# Patient Record
Sex: Female | Born: 1975 | Race: Black or African American | Hispanic: No | Marital: Married | Smoking: Never smoker
Health system: Southern US, Community
[De-identification: ages and names within clinical notes are randomized; demographics above are authoritative.]

## PROBLEM LIST (undated history)

## (undated) DIAGNOSIS — E785 Hyperlipidemia, unspecified: Secondary | ICD-10-CM

## (undated) DIAGNOSIS — R519 Headache, unspecified: Secondary | ICD-10-CM

## (undated) DIAGNOSIS — F8081 Childhood onset fluency disorder: Secondary | ICD-10-CM

## (undated) DIAGNOSIS — H547 Unspecified visual loss: Secondary | ICD-10-CM

## (undated) DIAGNOSIS — M1990 Unspecified osteoarthritis, unspecified site: Secondary | ICD-10-CM

## (undated) DIAGNOSIS — R7303 Prediabetes: Secondary | ICD-10-CM

## (undated) DIAGNOSIS — R635 Abnormal weight gain: Secondary | ICD-10-CM

## (undated) DIAGNOSIS — R413 Other amnesia: Secondary | ICD-10-CM

## (undated) DIAGNOSIS — R410 Disorientation, unspecified: Secondary | ICD-10-CM

## (undated) DIAGNOSIS — R51 Headache: Secondary | ICD-10-CM

## (undated) HISTORY — DX: Childhood onset fluency disorder: F80.81

## (undated) HISTORY — DX: Abnormal weight gain: R63.5

## (undated) HISTORY — DX: Headache, unspecified: R51.9

## (undated) HISTORY — DX: Other amnesia: R41.3

## (undated) HISTORY — DX: Prediabetes: R73.03

## (undated) HISTORY — DX: Hyperlipidemia, unspecified: E78.5

## (undated) HISTORY — PX: MOUTH SURGERY: SHX715

## (undated) HISTORY — PX: UTERINE FIBROID SURGERY: SHX826

## (undated) HISTORY — DX: Unspecified visual loss: H54.7

## (undated) HISTORY — DX: Unspecified osteoarthritis, unspecified site: M19.90

## (undated) HISTORY — DX: Headache: R51

## (undated) HISTORY — PX: ABDOMINAL HYSTERECTOMY: SHX81

## (undated) HISTORY — DX: Disorientation, unspecified: R41.0

---

## 2005-10-06 ENCOUNTER — Encounter: Payer: Self-pay | Admitting: Family Medicine

## 2008-10-28 ENCOUNTER — Emergency Department (HOSPITAL_BASED_OUTPATIENT_CLINIC_OR_DEPARTMENT_OTHER): Admission: EM | Admit: 2008-10-28 | Discharge: 2008-10-28 | Payer: Self-pay | Admitting: Emergency Medicine

## 2009-01-07 ENCOUNTER — Emergency Department (HOSPITAL_BASED_OUTPATIENT_CLINIC_OR_DEPARTMENT_OTHER): Admission: EM | Admit: 2009-01-07 | Discharge: 2009-01-07 | Payer: Self-pay | Admitting: Emergency Medicine

## 2009-01-15 ENCOUNTER — Ambulatory Visit: Payer: Self-pay | Admitting: Obstetrics and Gynecology

## 2009-01-15 LAB — CONVERTED CEMR LAB: Trich, Wet Prep: NONE SEEN

## 2009-02-25 ENCOUNTER — Ambulatory Visit: Payer: Self-pay | Admitting: Obstetrics and Gynecology

## 2009-03-12 ENCOUNTER — Ambulatory Visit: Payer: Self-pay | Admitting: Obstetrics & Gynecology

## 2009-03-18 ENCOUNTER — Ambulatory Visit (HOSPITAL_COMMUNITY): Admission: RE | Admit: 2009-03-18 | Discharge: 2009-03-18 | Payer: Self-pay | Admitting: Obstetrics & Gynecology

## 2009-04-27 ENCOUNTER — Inpatient Hospital Stay (HOSPITAL_COMMUNITY): Admission: RE | Admit: 2009-04-27 | Discharge: 2009-04-29 | Payer: Self-pay | Admitting: Obstetrics & Gynecology

## 2009-04-27 ENCOUNTER — Encounter: Payer: Self-pay | Admitting: Obstetrics & Gynecology

## 2009-04-27 ENCOUNTER — Ambulatory Visit: Payer: Self-pay | Admitting: Obstetrics & Gynecology

## 2009-05-15 ENCOUNTER — Ambulatory Visit: Payer: Self-pay | Admitting: Obstetrics & Gynecology

## 2009-05-28 ENCOUNTER — Ambulatory Visit: Payer: Self-pay | Admitting: Obstetrics & Gynecology

## 2009-06-10 ENCOUNTER — Ambulatory Visit: Payer: Self-pay | Admitting: Obstetrics & Gynecology

## 2009-09-16 ENCOUNTER — Ambulatory Visit: Payer: Self-pay | Admitting: Family Medicine

## 2009-09-16 ENCOUNTER — Encounter (INDEPENDENT_AMBULATORY_CARE_PROVIDER_SITE_OTHER): Payer: Self-pay | Admitting: *Deleted

## 2009-09-16 DIAGNOSIS — E669 Obesity, unspecified: Secondary | ICD-10-CM

## 2009-09-16 DIAGNOSIS — M79609 Pain in unspecified limb: Secondary | ICD-10-CM

## 2009-09-16 DIAGNOSIS — Z8541 Personal history of malignant neoplasm of cervix uteri: Secondary | ICD-10-CM

## 2009-09-16 DIAGNOSIS — G932 Benign intracranial hypertension: Secondary | ICD-10-CM | POA: Insufficient documentation

## 2009-09-16 LAB — CONVERTED CEMR LAB
Hemoglobin: 11.9 g/dL — ABNORMAL LOW (ref 12.0–15.0)
RBC: 3.9 M/uL (ref 3.87–5.11)
RDW: 12.8 % (ref 11.5–15.5)

## 2009-09-30 ENCOUNTER — Ambulatory Visit: Payer: Self-pay | Admitting: Family Medicine

## 2009-09-30 ENCOUNTER — Encounter: Payer: Self-pay | Admitting: Family Medicine

## 2009-09-30 LAB — CONVERTED CEMR LAB
AST: 15 units/L (ref 0–37)
Alkaline Phosphatase: 50 units/L (ref 39–117)
Amphetamine Screen, Ur: NEGATIVE
CO2: 24 meq/L (ref 19–32)
Chloride: 106 meq/L (ref 96–112)
Cholesterol: 211 mg/dL — ABNORMAL HIGH (ref 0–200)
Creatinine,U: 138.5 mg/dL
Ethyl Alcohol: <10 mg/dL (ref <10)
Glucose, Bld: 93 mg/dL (ref 70–99)
HDL: 68 mg/dL (ref >39)
LDL Cholesterol: 129 mg/dL — ABNORMAL HIGH (ref 0–99)
Marijuana Metabolite: NEGATIVE
Methadone: NEGATIVE
Opiate Screen, Urine: NEGATIVE
Phencyclidine (PCP): NEGATIVE
Potassium: 5.1 meq/L (ref 3.5–5.3)
Propoxyphene: NEGATIVE
Total Protein: 7.1 g/dL (ref 6.0–8.3)
VLDL: 14 mg/dL (ref 0–40)

## 2009-10-09 ENCOUNTER — Ambulatory Visit: Payer: Self-pay | Admitting: Family Medicine

## 2009-10-09 DIAGNOSIS — N644 Mastodynia: Secondary | ICD-10-CM | POA: Insufficient documentation

## 2009-10-09 DIAGNOSIS — R350 Frequency of micturition: Secondary | ICD-10-CM | POA: Insufficient documentation

## 2009-10-09 DIAGNOSIS — H4710 Unspecified papilledema: Secondary | ICD-10-CM

## 2009-10-09 DIAGNOSIS — G5600 Carpal tunnel syndrome, unspecified upper limb: Secondary | ICD-10-CM

## 2009-10-09 LAB — CONVERTED CEMR LAB
Bilirubin Urine: NEGATIVE
Urobilinogen, UA: 0.2
pH: 6

## 2009-10-13 ENCOUNTER — Encounter: Admission: RE | Admit: 2009-10-13 | Discharge: 2009-10-13 | Payer: Self-pay | Admitting: Family Medicine

## 2009-10-14 ENCOUNTER — Encounter: Payer: Self-pay | Admitting: Family Medicine

## 2009-10-16 ENCOUNTER — Telehealth: Payer: Self-pay | Admitting: Family Medicine

## 2009-10-23 ENCOUNTER — Encounter: Payer: Self-pay | Admitting: *Deleted

## 2009-10-26 ENCOUNTER — Ambulatory Visit (HOSPITAL_COMMUNITY): Admission: RE | Admit: 2009-10-26 | Discharge: 2009-10-26 | Payer: Self-pay | Admitting: Family Medicine

## 2009-10-29 ENCOUNTER — Telehealth: Payer: Self-pay | Admitting: Family Medicine

## 2009-11-06 ENCOUNTER — Ambulatory Visit: Payer: Self-pay | Admitting: Family Medicine

## 2009-11-26 ENCOUNTER — Ambulatory Visit: Payer: Self-pay | Admitting: Sports Medicine

## 2009-12-01 ENCOUNTER — Ambulatory Visit: Payer: Self-pay | Admitting: Family Medicine

## 2009-12-01 LAB — CONVERTED CEMR LAB
Albumin: 4 g/dL (ref 3.5–5.2)
Alkaline Phosphatase: 50 units/L (ref 39–117)
BUN: 12 mg/dL (ref 6–23)
Bilirubin Urine: NEGATIVE
CO2: 24 meq/L (ref 19–32)
Calcium: 8.5 mg/dL (ref 8.4–10.5)
Chloride: 106 meq/L (ref 96–112)
Ketones, urine, test strip: NEGATIVE
Potassium: 4.1 meq/L (ref 3.5–5.3)
Protein, U semiquant: NEGATIVE
Sodium: 139 meq/L (ref 135–145)
Total Bilirubin: 0.3 mg/dL (ref 0.3–1.2)
Total Protein: 7.1 g/dL (ref 6.0–8.3)
WBC Urine, dipstick: NEGATIVE

## 2009-12-02 ENCOUNTER — Encounter: Payer: Self-pay | Admitting: Family Medicine

## 2009-12-17 ENCOUNTER — Telehealth (INDEPENDENT_AMBULATORY_CARE_PROVIDER_SITE_OTHER): Payer: Self-pay | Admitting: *Deleted

## 2009-12-21 ENCOUNTER — Telehealth (INDEPENDENT_AMBULATORY_CARE_PROVIDER_SITE_OTHER): Payer: Self-pay | Admitting: *Deleted

## 2009-12-25 ENCOUNTER — Ambulatory Visit: Payer: Self-pay | Admitting: Family Medicine

## 2009-12-29 ENCOUNTER — Encounter: Payer: Self-pay | Admitting: Family Medicine

## 2010-01-02 ENCOUNTER — Emergency Department (HOSPITAL_BASED_OUTPATIENT_CLINIC_OR_DEPARTMENT_OTHER): Admission: EM | Admit: 2010-01-02 | Discharge: 2010-01-03 | Payer: Self-pay | Admitting: Emergency Medicine

## 2010-01-22 ENCOUNTER — Encounter: Payer: Self-pay | Admitting: Family Medicine

## 2010-01-25 ENCOUNTER — Encounter: Payer: Self-pay | Admitting: Family Medicine

## 2010-01-26 ENCOUNTER — Ambulatory Visit: Payer: Self-pay | Admitting: Family Medicine

## 2010-02-19 ENCOUNTER — Encounter: Payer: Self-pay | Admitting: Family Medicine

## 2010-02-22 ENCOUNTER — Encounter: Payer: Self-pay | Admitting: Family Medicine

## 2010-03-03 ENCOUNTER — Encounter: Payer: Self-pay | Admitting: Family Medicine

## 2010-03-09 ENCOUNTER — Ambulatory Visit: Payer: Self-pay | Admitting: Family Medicine

## 2010-03-09 ENCOUNTER — Encounter: Payer: Self-pay | Admitting: *Deleted

## 2010-03-09 DIAGNOSIS — R300 Dysuria: Secondary | ICD-10-CM | POA: Insufficient documentation

## 2010-03-09 LAB — CONVERTED CEMR LAB
Blood in Urine, dipstick: NEGATIVE
Epithelial cells, urine: >20 /lpf
Ketones, urine, test strip: NEGATIVE
Protein, U semiquant: NEGATIVE
Specific Gravity, Urine: 1.02
Urobilinogen, UA: 0.2

## 2010-03-11 ENCOUNTER — Ambulatory Visit (HOSPITAL_COMMUNITY): Admission: RE | Admit: 2010-03-11 | Discharge: 2010-03-11 | Payer: Self-pay | Admitting: Family Medicine

## 2010-03-11 ENCOUNTER — Encounter: Payer: Self-pay | Admitting: Family Medicine

## 2010-03-11 ENCOUNTER — Ambulatory Visit: Payer: Self-pay | Admitting: Surgery

## 2010-03-15 ENCOUNTER — Encounter: Payer: Self-pay | Admitting: Family Medicine

## 2010-03-18 ENCOUNTER — Encounter: Payer: Self-pay | Admitting: Family Medicine

## 2010-03-18 DIAGNOSIS — Q283 Other malformations of cerebral vessels: Secondary | ICD-10-CM | POA: Insufficient documentation

## 2010-04-30 ENCOUNTER — Encounter: Payer: Self-pay | Admitting: Family Medicine

## 2010-06-04 ENCOUNTER — Ambulatory Visit: Payer: Self-pay | Admitting: Family Medicine

## 2010-06-07 ENCOUNTER — Telehealth: Payer: Self-pay | Admitting: *Deleted

## 2010-06-18 ENCOUNTER — Encounter: Payer: Self-pay | Admitting: Family Medicine

## 2010-07-02 ENCOUNTER — Encounter: Payer: Self-pay | Admitting: Family Medicine

## 2010-07-07 ENCOUNTER — Ambulatory Visit: Payer: Self-pay | Admitting: Family Medicine

## 2010-08-02 ENCOUNTER — Encounter: Payer: Self-pay | Admitting: Family Medicine

## 2010-08-15 HISTORY — PX: BRAIN SURGERY: SHX531

## 2010-09-14 NOTE — Assessment & Plan Note (Signed)
Summary: NP,tcb   Vital Signs:  Patient profile:   35 year old female Height:      62 inches Weight:      238.5 pounds BMI:     43.78 Temp:     97.5 degrees F oral Pulse rate:   74 / minute BP sitting:   133 / 83  (left arm) Cuff size:   regular  Vitals Entered By: Gladstone Pih (September 16, 2009 9:49 AM) CC: NP, get est, pain from hips down X 4 month, refill pain meds Is Patient Diabetic? No Pain Assessment Patient in pain? yes     Location: legs Intensity: 8 Type: sharp Onset of pain  X 4 mos   CC:  NP, get est, pain from hips down X 4 month, and refill pain meds.  History of Present Illness: Patient is new to our office, she has recently re-acquired Medicaid coverage and is trying to re-establish care,.  Has not been under regular medical care since 2006.  Her main issues of concern on this first visit regard her bilat lowe ext pain, which radiates down from her hips to her toes.  Worse when she has been standing for a logn while at work.  No weakness or falls. No trauma history.   She relates the worsening of the pain to recent surgery for BSO in Sept 2010 by Dr. Silas Flood in [facility], also to prior surgery in 2006 for hysterectomy for endometriosis.  Also of note, patient was diagnosed with pseudotumor cerebri in 2006 by neurologists in [facility], workup included MRI and LP.  She had failed Topamax and NSAIDs, Tylenol at that time.  She was controlled on Vicodin for her HA pain, which she does not have now.   Regarding pain control, she requests a refill of her Vicodin.  She had been on Vicodin for her pseudotumor with good results; also postoperatively from her pelvic surgeries and she states that most recentlty she had been maintained on it for her bilateral leg pain.  She is not sure of her diagnosis regarding the leg pain.  Her prior PCP was Dr. Babs Sciara in [facility].   Habits & Providers  Alcohol-Tobacco-Diet     Tobacco Status: never  Current Medications  (verified): 1)  Hydrocodone-Acetaminophen 5-500 Mg Tabs (Hydrocodone-Acetaminophen) .... Sig: Take 1 To 2 Tabs By Mouth Every 12 Hours As Needed For Extreme Pain 2)  Estradiol 2 Mg Tabs (Estradiol) .Marland Kitchen.. 1 Tab By Mouth Once Daily 3)  Amitriptyline Hcl 75 Mg Tabs (Amitriptyline Hcl) .... Sig: Take 1 Tab By Mouth Once Daily  Allergies (verified): 1)  ! Tramadol Hcl (Tramadol Hcl)  Past History:  Past Medical History: cervical cancer 2006, s/p hysterectomy. Bilat oophorectomy Sept 13, 2010.  Pseudotumor cerebri,workup with [facility] in [facility], workup included MRI, LP.  Was maintained on Vicodin for headache pain.  Failed Topamax, NSAIDs and Tylenol in 2006. Cervical cancer, hx of  Family History: Maternal grandmother with MI in her 65s.  Neither parent with MI.  No DM in family. No forms of cancer in family.  Social History: Married.  Has three children at home. Husband out of work. Works at [employer], on her feet for 4 hrs at at time.  Never smoked, no alcohol or recreational drugs. Smoking Status:  never  Review of Systems  The patient denies fever, weight loss, chest pain, syncope, dyspnea on exertion, headaches, and abdominal pain.    Physical Exam  General:  Obese well-appearing  woman, no apparent distress.  Eyes:  Clear sclerae bilaterally Ears:  External ear exam shows no significant lesions or deformities.  Otoscopic examination reveals clear canals, tympanic membranes are intact bilaterally without bulging, retraction, inflammation or discharge. Hearing is grossly normal bilaterally. Nose:  Small amount thin watery nasal discharge Mouth:  Oral mucosa and oropharynx without lesions or exudates.  Teeth in good repair. Neck:  No deformities, masses, or tenderness noted. Lungs:  Normal respiratory effort, chest expands symmetrically. Lungs are clear to auscultation, no crackles or wheezes. Heart:  Normal rate and regular rhythm. S1 and S2 normal without  gallop, murmur, click, rub or other extra sounds. Abdomen:  Obese, nontender.  Msk:  Tenderness to palpate along the paraspinous mm lumbar region.  Straight leg raise negative.   Full passive ROM with hip flex/extension, rotation int/ext, abd/adduction. Pulses:  Palpable dp pulses bilaterally. Extremities:  No edema of feet.  No skin maceration between toes. Neurologic:  Sensation in feet grossly intact with fine touch, pinprick.    Impression & Recommendations:  Problem # 1:  LEG PAIN, BILATERAL (ICD-729.5) Unclear what diagnosis we are treating.  By my exam, no clear lumbar radiculopathy.  Will request old records from Dr Willa Rough, from Neurologists in order to better understand prior treatment.  Will refill her Vicodin today, with the understanding that we need to get a better idea of what we are treating.  Discussed the nuances of Controlled Substances prescribing in this office, including the terms of Pain Med Contract.  We will only engage in such a contract if it is decided that this medication will be a chronic medication.  Future Orders: Miscellaneous Lab Charge-[facility] [id number]) ... 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  Had been off the Vicodin for some time.  Will request records from her neurologists.  Problem # 3:  OBESITY, UNSPECIFIED (ICD-278.00) Patient has not had assessment for DM screening, lipid screening. W ill order these, alogn with CBC and UDS (see #1).  Orders: CBC-[facility] ([id number])Future Orders: Lipid-[facility] ([id number]) ... 09/29/2010 Comp Met-[facility] ([id number]) ... 09/16/2010  Complete Medication List: 1)  Hydrocodone-acetaminophen 5-500 Mg Tabs (Hydrocodone-acetaminophen) .... Sig: take 1 to 2 tabs by mouth every 12 hours as needed for extreme pain 2)  Estradiol 2 Mg Tabs (Estradiol) .Marland Kitchen.. 1 tab by mouth once daily 3)  Amitriptyline Hcl 75 Mg Tabs (Amitriptyline hcl) .... Sig: take 1 tab by mouth once daily  Other Orders: Flu  Vaccine 48yrs + (13086) Admin 1st Vaccine ([id number])  Patient Instructions: 1)  It was a pleasure to see you today.  2)  I am refilling your medications, including the Vicodin, for the coming month.  It will be important to get the old records from your former primary doctor, your neurologist at [facility] in [facility], and from Dr. Silas Flood in [facility]. 3)  I would like you to come for fasting labs in the coming 2 weeks, and to see me in the office in 4 to 5 weeks. Prescriptions: AMITRIPTYLINE HCL 75 MG TABS (AMITRIPTYLINE HCL) SIG: Take 1 tab by mouth once daily  #30 x 11   Entered and Authorized by:   Paula Compton MD   Signed by:   Paula Compton MD on 09/16/2009   Method used:   Electronically to        [facility] (retail)       [address]       [place], [place]  [address]       Ph: [phone]       Fax: [phone]   RxID:   [id number] ESTRADIOL 2 MG TABS (ESTRADIOL) 1 tab by mouth once daily  #30 x 11   Entered and Authorized by:   Paula Compton MD   Signed by:   Paula Compton MD on 09/16/2009   Method used:   Electronically to        [facility] (retail)       [address]       [place], [place]  [address]       Ph: [phone]       Fax: [phone]   RxID:   [id number] HYDROCODONE-ACETAMINOPHEN 5-500 MG TABS (HYDROCODONE-ACETAMINOPHEN) SIG: Take 1 to 2 tabs by mouth every 12 hours as needed for extreme pain  #120 x 0   Entered and Authorized by:   Paula Compton MD   Signed by:   Paula Compton MD on 09/16/2009   Method used:   Print then Give to Patient   RxID:   [id number]     Immunizations Administered:  Influenza Vaccine # 1:    Vaccine Type: Fluvax 3+    Site: right deltoid    Mfr: GlaxoSmithKline    Dose: 0.5 ml    Given by: Gladstone Pih    Exp. Date: 02/11/2010    Lot #: [id number]    VIS given: 05/2009    Physician counseled: yes  Flu Vaccine Consent Questions:    Do you  have a history of severe allergic reactions to this vaccine? no    Any prior history of allergic reactions to egg and/or gelatin? no    Do you have a sensitivity to the preservative Thimersol? no    Do you have a past history of Guillan-Barre Syndrome? no    Do you currently have an acute febrile illness? no    Have you ever had a severe reaction to latex? no    Vaccine information given and explained to patient? yes    Are you currently pregnant? no

## 2010-09-14 NOTE — Consult Note (Signed)
[facility]   Imported By: Clydell Hakim 05/12/2010 11:55:27  _____________________________________________________________________  External Attachment:    Type:   Image     Comment:   External Document

## 2010-09-14 NOTE — Miscellaneous (Signed)
Summary: Re: Neurosurgery  referral.  Clinical Lists Changes  received notification from [facility] that they are unable to complete Neurosurgery referral at this time due to lack of volunteer physicians in this speciality group. they will notifiy patient when they can process this referral. Theresia Lo RN  October 14, 2009 4:07 PM

## 2010-09-14 NOTE — Consult Note (Signed)
Summary: [facility]   Imported By: Clydell Hakim 03/23/2010 15:01:17  _____________________________________________________________________  External Attachment:    Type:   Image     Comment:   External Document

## 2010-09-14 NOTE — Miscellaneous (Signed)
Summary: [facility]   Imported By: Clydell Hakim 03/23/2010 15:03:13  _____________________________________________________________________  External Attachment:    Type:   Image     Comment:   External Document

## 2010-09-14 NOTE — Consult Note (Signed)
Summary: [facility]   Imported By: Clydell Hakim 03/23/2010 14:46:55  _____________________________________________________________________  External Attachment:    Type:   Image     Comment:   External Document

## 2010-09-14 NOTE — Progress Notes (Signed)
Summary: phn msg  Phone Note Call from Patient Call back at Home Phone [phone]   Caller: Patient Summary of Call: Asking to speak with Larita Fife concerning a procedure she had done on Monday. Initial call taken by: Clydell Hakim,  October 29, 2009 10:34 AM  Follow-up for Phone Call        spoke with patient and she would like results of MRI. advised will send message to Dr. Mauricio Po . may call her back at work # [phone] or cell number listed. advised patient that referral along with MRI results has been faxed to [facility], and waiting on appointment . Follow-up by: Theresia Lo RN,  October 29, 2009 10:49 AM    New/Updated Medications: HYDROCODONE-ACETAMINOPHEN 5-500 MG TABS (HYDROCODONE-ACETAMINOPHEN) SIG: Take 2 to 3 tabs by mouth every 12 hours as needed for extreme pain Prescriptions: HYDROCODONE-ACETAMINOPHEN 5-500 MG TABS (HYDROCODONE-ACETAMINOPHEN) SIG: Take 2 to 3 tabs by mouth every 12 hours as needed for extreme pain  #180 x 0   Entered and Authorized by:   Paula Compton MD   Signed by:   Paula Compton MD on 10/29/2009   Method used:   Print then Give to Patient   RxID:   [id number]  Called patient at work, left message.  Called [phone] number, spoke with her.  Feels okay now, headaches are coming back. Finished prednisone for carpal tunnel. Using pain med 2 tabs twice daily.  Will increase to 2-3 tabs twice daily, #180 zero refills.  Patient to pick up at front desk Paula Compton MD  October 29, 2009 12:16 PM

## 2010-09-14 NOTE — Consult Note (Signed)
Summary: [facility]   Imported By: Clydell Hakim 03/23/2010 15:01:50  _____________________________________________________________________  External Attachment:    Type:   Image     Comment:   External Document

## 2010-09-14 NOTE — Assessment & Plan Note (Signed)
Summary: F/U EO   Vital Signs:  Patient profile:   35 year old female Height:      62 inches Weight:      245.2 pounds BMI:     45.01 Temp:     98.3 degrees F oral Pulse rate:   78 / minute BP sitting:   117 / 78  (left arm) Cuff size:   large  Vitals Entered By: Gladstone Pih (January 26, 2010 8:38 AM) CC: F/U Is Patient Diabetic? No Pain Assessment Patient in pain? no        CC:  F/U.  History of Present Illness: Caitlin Clayton has been seen by [facility]; Jolene Schimke on June 7 and Neurosurg 6/10; they plan to see her again on July 8th to determine surgical plan.  She was started on Diamox again, started last Thursday June 9th and has been sleepy since starting it.  Was advised not to drive because her peripheral vision is deteriorating.  Has requested her subspecialists to send notes to our office.  Hand pain is controlled with percocet, as is her headache pain.    School is out; therefore, Koni is not working now.  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  Resumes in August.   Requests taht I refill her Diamox in the form of 250mg  tabs, to take 2 twice daily (original Rx from [facility] was for 500mg  twice daily).  Is cheaper at [facility] when prescribed in 250mg  tablets.    Has succeeded in staying away from regular sodas. Diamox makes the sodas taste bad.  Lost 4 lbs in one month.   Habits & Providers  Alcohol-Tobacco-Diet     Tobacco Status: never  Current Medications (verified): 1)  Estradiol 2 Mg Tabs (Estradiol) .Marland Kitchen.. 1 Tab By Mouth Once Daily 2)  Amitriptyline Hcl 75 Mg Tabs (Amitriptyline Hcl) .... Sig: Take 1 Tab By Mouth Once Daily 3)  Percocet 5-325 Mg Tabs (Oxycodone-Acetaminophen) .... Sig: 1 To 2 Tabs By Mouth Every 8 Hours As Needed Extreme Pain 4)  Loratadine 10 Mg Tabs (Loratadine) .Marland Kitchen.. 1 By Mouth One Time Daily 5)  Acetazolamide 250 Mg Tabs (Acetazolamide) .... 2 Tabs By Mouth Twice Daily  Allergies (verified): 1)  !  Tramadol Hcl (Tramadol Hcl)  Family History: Reviewed history from 12/01/2009 and no changes required. Maternal grandmother with MI in her 83s.  Neither parent with MI.  No DM in family. No forms of cancer in family.  December 01, 2009: Son with syncopal episodes, getting workup with pedi cardiology  Social History: Reviewed history from 12/01/2009 and no changes required. Married.  Has three children at home. Husband out of work. Works at [employer], on her feet for 4 hrs at at time.  Never smoked, no alcohol or recreational drugs.   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  December 01, 2009: Working at [employer]. Doesn't collect a paycheck when school is out (June 30 to end August). Concerned about finances. Son still getting workup for syncope.  Physical Exam  General:  well appearing, no apparent distress Mouth:  moist mucus membranes Extremities:  palpable dp pulses bilat, palpable radial pulses bilat.  No palpable pitting edema in hands or ankles.  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  For appt with them on July 8th.   HA controlled with  percocet.  I have told her that she may call two days ahead of her next percocet refill (due July 14th) for a Rx to be left at the front desk.   I would like to see her after surgery plans set.   Orders: [facility]- Est  Level 4 (21308)  Problem # 2:  CARPAL TUNNEL SYNDROME, BILATERAL (ICD-354.0)  To defer ortho appt for this, as her pseudotumor is taking precedence at this time.  Continue to use cock-up splints and work on weight loss.   Orders: [facility]- Est  Level 4 (65784)  Problem # 3:  OBESITY, UNSPECIFIED (ICD-278.00)  Losing weight.  Given encouragement.  COntinue with small dietary changes, as well as mildly increased physical activity.  Be mindful of over-exertion,  extremely hot weather.  Orders: [facility]- Est  Level 4 (69629)  Complete Medication List: 1)  Estradiol 2 Mg Tabs (Estradiol) .Marland Kitchen.. 1 tab by mouth once daily 2)  Amitriptyline Hcl 75 Mg Tabs (Amitriptyline hcl) .... Sig: take 1 tab by mouth once daily 3)  Percocet 5-325 Mg Tabs (Oxycodone-acetaminophen) .... Sig: 1 to 2 tabs by mouth every 8 hours as needed extreme pain 4)  Loratadine 10 Mg Tabs (Loratadine) .Marland Kitchen.. 1 by mouth one time daily 5)  Acetazolamide 250 Mg Tabs (Acetazolamide) .... 2 tabs by mouth twice daily  Patient Instructions: 1)  It was a pleasure to see you today.  I am glad to hear you are continuing with the neurosurgeons and ophthalmologists from [facility]. 2)  Congratulations on losing over 4 pounds in one month!  Keep it up; if you can continue to do without regular sodas and sweetened beverages, you can continue to lose weight.  3)  You may walk and do mild physical activity; be careful not to over-exert yourself in the hot weather.   4)  I have rewritten your prescription for Diamox as the pharmacist recommended, to reduce the cost.  5)  I would like to see you after your surgery dates with neurosurgery, or sooner if needed. Prescriptions: PERCOCET 5-325 MG TABS (OXYCODONE-ACETAMINOPHEN) SIG: 1 to 2 tabs by mouth every 8 hours as needed extreme pain  #180 x 0   Entered and Authorized by:   Paula Compton MD   Signed by:   Paula Compton MD on 01/26/2010   Method used:   Print then Give to Patient   RxID:   [id number] AMITRIPTYLINE HCL 75 MG TABS (AMITRIPTYLINE HCL) SIG: Take 1 tab by mouth once daily  #90 x 4   Entered and Authorized by:   Paula Compton MD   Signed by:   Paula Compton MD on 01/26/2010   Method used:   Electronically to        [facility] [phone]* (retail)       [address]       [place], [place]  [address]       Ph: [phone]       Fax: [phone]   RxID:   [id number] ACETAZOLAMIDE 250 MG TABS (ACETAZOLAMIDE) 2 tabs by mouth twice  daily  #120 x 1   Entered and Authorized by:   Paula Compton MD   Signed by:   Paula Compton MD on 01/26/2010   Method used:   Electronically to        [facility] [phone]* (retail)       [address]       [place], [place]  [address]  Ph: [phone]       Fax: [phone]   RxID:   [id number]

## 2010-09-14 NOTE — Miscellaneous (Signed)
Summary: re: diflucan/ts  Clinical Lists Changes called pt and lmvm to call back. please tell pt 'yeast in urine' and we have sent the RX to her pharmacy...waiting for pt to call back.Arlyss Repress CMA,  March 09, 2010 9:52 AM  Medications: Rx of FLUCONAZOLE 150 MG TABS (FLUCONAZOLE) SIG Take 1 tab by mouth one time;  #1 x 0;  Signed;  Entered by: Arlyss Repress CMA,;  Authorized by: Paula Compton MD;  Method used: Electronically to [facility] [phone]*, [address], [place], [place]  [address], Ph: [phone], Fax: [phone]    Prescriptions: FLUCONAZOLE 150 MG TABS (FLUCONAZOLE) SIG Take 1 tab by mouth one time  #1 x 0   Entered by:   Arlyss Repress CMA,   Authorized by:   Paula Compton MD   Signed by:   Arlyss Repress CMA, on 03/09/2010   Method used:   Electronically to        [facility] [phone]* (retail)       [address]       [place], [place]  [address]       Ph: [phone]       Fax: [phone]   RxID:   [id number]

## 2010-09-14 NOTE — Consult Note (Signed)
Summary: [facility] Neurosurgery   Imported By: Clydell Hakim 03/23/2010 15:02:33  _____________________________________________________________________  External Attachment:    Type:   Image     Comment:   External Document

## 2010-09-14 NOTE — Assessment & Plan Note (Signed)
Summary: cpe/kh   Vital Signs:  Patient profile:   35 year old female Weight:      240.8 pounds BMI:     44.20 Temp:     98.5 degrees F Pulse rate:   97 / minute BP sitting:   128 / 88  (left arm)  Vitals Entered By: Starleen Blue RN (October 09, 2009 3:23 PM) CC: cpe Is Patient Diabetic? No Pain Assessment Patient in pain? yes     Location: head Intensity: 6   CC:  cpe.  History of Present Illness: Several issues to address today;  1) Patient with history of pseudotumor cerebri, documentation was reviewed by me, obtained by her prior Neurology practice [facility] [place]), report indicated that she had been treated for headaches attributed to both pseudotumor and migraine.  Had been on Diamox, also Topamax and narcotic medication.  She had a lumbar puncture and severalimaging studies to diagnose the pseudotumor at that time.  Argie reports that she has been having frequent headaches again, on an almost daily basis.  Worsened by children sounds at home.  Stress also affects adversely. Her narcotic pain med helps somewhat, but not completely.  taking as scheduled, not going over amount prescribed but also not taking less than prescribed.  Along with the headaches, also reportssome decreased visual acuity. She wetn to her eye doctor [facility], [place], [place] [phone], fax [phone]) yesterday, brings in note from 10/08/09 along with retinal photographs showing bilat papilledema. NOte recommends "LP ASAP".    2) Complaint of bilateral carpal tunnel syndrome, has been sleeping with cock-up splints every night and taking some ibuprofen without much relief. Job as Conservation officer, nature aggravates the bilat pain. She is R hand dominant.   3) Has had frequent urination for some time.No dysuria, no change in urine appearance.  Has not lost control, but does have some urgency sometimes.  Again, polyuria without dysuria.   4) Reports pain in her L lateral breast.  No nipple discharge.  May have felt a lump in that area of L breast. No known family history of breast cancer. Patient had bilat oophorectomy at [facility] in Sept 2010, has personal gyn cancer history. Also history of endometriosis.   Habits & Providers  Alcohol-Tobacco-Diet     Tobacco Status: never  Current Medications (verified): 1)  Hydrocodone-Acetaminophen 5-500 Mg Tabs (Hydrocodone-Acetaminophen) .... Sig: Take 1 To 2 Tabs By Mouth Every 12 Hours As Needed For Extreme Pain 2)  Estradiol 2 Mg Tabs (Estradiol) .Marland Kitchen.. 1 Tab By Mouth Once Daily 3)  Amitriptyline Hcl 75 Mg Tabs (Amitriptyline Hcl) .... Sig: Take 1 Tab By Mouth Once Daily 4)  Prednisone 20 Mg Tabs (Prednisone) .... Sig: Take 2 Tabs By Mouth One Time Daily For 10 Days  Allergies (verified): 1)  ! Tramadol Hcl (Tramadol Hcl)  Family History: Reviewed history from 09/16/2009 and no changes required. Maternal grandmother with MI in her 66s.  Neither parent with MI.  No DM in family. No forms of cancer in family.  Social History: Reviewed history from 09/16/2009 and no changes required. Married.  Has three children at home. Husband out of work. Works at [employer], on her feet for 4 hrs at at time.  Never smoked, no alcohol or recreational drugs.   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   Physical Exam  General:  well appearing, no acute distress Eyes:  clear sclerae.  Papilledema visible with non-dilated eye exam. EOMI.  PERRL.  Ears:  External ear exam shows no significant lesions or deformities.  Otoscopic examination reveals clear canals, tympanic membranes are intact bilaterally without bulging, retraction, inflammation or discharge. Hearing is grossly normal bilaterally. Mouth:  Oral mucosa and oropharynx without lesions or exudates.  Teeth in good repair. Neck:  No deformities, masses, or tenderness noted. Lungs:  Normal respiratory effort, chest expands symmetrically. Lungs are clear to auscultation, no crackles or  wheezes. Heart:  Normal rate and regular rhythm. S1 and S2 normal without gallop, murmur, click, rub or other extra sounds. Msk:  Palpable radial pulses bilat  Positive Tinnels and Phalens sign bilaterally.  Neurologic:  sensatiokn distal fingertips intact.  Full handgrip bilaterally. No hand atrophy.    Impression & Recommendations:  Problem # 1:  PSEUDOTUMOR CEREBRI (ICD-348.2) I had viewed patient's old neurology records from [facility] in [facility], which had mentioned prior treatment with Diamox, also use of Topamax and Vicodin for headaches related to Pseudotumor.  Janae states that her neurologist had been talking about referring her for shunt placement, although she never saw a neurosurgeon for this.  Given apparent new onset papilledema reported by her eye doctor [facility] on 10/08/09 and continued headaches that occur nearly daily, will refer for neurosurgical opinion regarding necessity of this.  Controlled substances contract for narcotics which she has used for management of headaches chronically in the past.  UDS corroborates use.  Orders: Neurosurgeon Referral (Neurosurgeon) [facility]- Est  Level 4 ([address])  Problem # 2:  MASTALGIA (ICD-611.71) Patient reports pain and possible lump on the lateral aspect of L breast.  No familyhistory ofbreast cancer, but she has a personal history of cervical cancer.  Given long list of items being discussed at today's visit, we are opting to refer for breast imaging today and defer CBE until her next visit in 1 month. She is amenable to this approach.  Orders: Mammogram (Diagnostic) (Mammo) [facility]- Est  Level 4 ([address]454)  Problem # 3:  FREQUENCY, URINARY (ICD-788.41) Unclear etiology.  UA dipstick today with question of rare RBCs on micro.  Question possible relationship with increased ICP from pseudotumor cerebri.  Orders: Urinalysis-[facility] (00000) [facility]- Est  Level 4 ([address])  Problem # 4:  LEG PAIN, BILATERAL (ICD-729.5)  Discussed  management of leg pain, which I believe will be ameliorated by increased activity and weight loss.  Discussed weight loss and positive implications on pseudotumor, leg pains, and general well being.  She is interested in this.  Began discussing ways she can make small sustainable changes in lifestyle, diet, and activity.  She and a co-worker plan to begin a walking regimen at work on their breaks.  ENcouragement given.   Orders: [facility]- Est  Level 4 ([address])  Problem # 5:  CARPAL TUNNEL SYNDROME, BILATERAL (ICD-354.0)  Bilateral carpal tunnel syndrome.  WOrks as Conservation officer, nature. Is using bilat cock-up splints nightly along with ibuprofen wiht mild relief, suboptimal.  Will give trial of short-course systemic steroid.  For consideration steroid injection, or Sports Med referral for consideration non-surgical alternatives.  Orders: [facility]- Est  Level 4 ([address])  Problem # 6:  OBESITY, UNSPECIFIED (ICD-278.00)  Discussed implications forweight loss. Motivational Interviewing techniques attempted.  Patient intends to implement small changes that are meaningful to her, pledges to start walking plan with her co-worker and to stop drinking soda.   Orders: [facility]- Est  Level 4 ([address]562)  Complete Medication List: 1)  Hydrocodone-acetaminophen 5-500 Mg Tabs (Hydrocodone-acetaminophen) .... Sig: take 1 to 2 tabs by mouth  every 12 hours as needed for extreme pain 2)  Estradiol 2 Mg Tabs (Estradiol) .Marland Kitchen.. 1 tab by mouth once daily 3)  Amitriptyline Hcl 75 Mg Tabs (Amitriptyline hcl) .... Sig: take 1 tab by mouth once daily 4)  Prednisone 20 Mg Tabs (Prednisone) .... Sig: take 2 tabs by mouth one time daily for 10 days Prescriptions: PREDNISONE 20 MG TABS (PREDNISONE) SIG: take 2 tabs by mouth one time daily for 10 days  #20 x 0   Entered and Authorized by:   Paula Compton MD   Signed by:   Paula Compton MD on 10/09/2009   Method used:   Electronically to        [facility] (retail)       [address]        [place], [place]  [address]       Ph: [phone]       Fax: [phone]   RxID:   [id number] PREDNISONE 20 MG TABS (PREDNISONE) SIG: take 2 tabs by mouth one time daily for 10 days  #20 x 0   Entered and Authorized by:   Paula Compton MD   Signed by:   Paula Compton MD on 10/09/2009   Method used:   Electronically to        [facility] (retail)       [address]       [place], [place]  [id number]       Ph: [phone]       Fax: [phone]   RxID:   [id number] HYDROCODONE-ACETAMINOPHEN 5-500 MG TABS (HYDROCODONE-ACETAMINOPHEN) SIG: Take 1 to 2 tabs by mouth every 12 hours as needed for extreme pain  #120 x 0   Entered and Authorized by:   Paula Compton MD   Signed by:   Paula Compton MD on 10/09/2009   Method used:   Print then Give to Patient   RxID:   [id number]   Laboratory Results   Urine Tests  Date/Time Received: October 09, 2009 3:38 PM  Date/Time Reported: October 09, 2009 4:12 PM   Routine Urinalysis   Color: yellow Appearance: Clear Glucose: negative   (Normal Range: Negative) Bilirubin: negative   (Normal Range: Negative) Ketone: negative   (Normal Range: Negative) Spec. Gravity: >=1.030   (Normal Range: 1.003-1.035) Blood: trace-intact   (Normal Range: Negative) pH: 6.0   (Normal Range: 5.0-8.0) Protein: trace   (Normal Range: Negative) Urobilinogen: 0.2   (Normal Range: 0-1) Nitrite: negative   (Normal Range: Negative) Leukocyte Esterace: negative   (Normal Range: Negative)  Urine Microscopic WBC/HPF: 0-3 RBC/HPF: 0-3 Bacteria/HPF: 2+ Mucous/HPF: 2+ Epithelial/HPF: 5-10    Comments: ...........test performed by...........Marland KitchenTerese Door, CMA

## 2010-09-14 NOTE — Consult Note (Signed)
Summary: [facility]   Imported By: Caitlin Clayton 03/23/2010 14:45:23  _____________________________________________________________________  External Attachment:    Type:   Image     Comment:   External Document

## 2010-09-14 NOTE — Assessment & Plan Note (Signed)
Summary: f/u bmc   Vital Signs:  Patient profile:   35 year old female Weight:      242 pounds Pulse rate:   68 / minute BP sitting:   128 / 85  (right arm)  Vitals Entered By: Arlyss Repress CMA, (June 04, 2010 2:34 PM) CC: f/up and refill meds. Is Patient Diabetic? No Pain Assessment Patient in pain? no       Vision Screening:Left eye w/o correction: 20 / 40 Right Eye w/o correction: 20 / 30 Both eyes w/o correction:  20/ 40        Vision Entered By: Jimmy Footman, CMA (June 04, 2010 3:15 PM)   CC:  f/up and refill meds..  History of Present Illness: Caitlin Clayton is here today for followup of a few issues.  I received the most recent visit notes from her neurosurgeon Dr Kelby Aline at [facility] and she confirms the plan from that visit.  Had planned to follow up there again in December, and have another MRV done at that time.  Continues to take the Diamox, although side effects are disagreeable to her.   She reports a marked increase in her headaches since her last visit to [facility] on Sept 16th; says that the week following that visit, she began with the same headache she had before her procedure there.  Photo/phonophobia, and nausea with occasional vomiting.  Has had these spells almost nightly, rolls into a ball and turns off the light.  Vicodin, whjich I have been prescribing for her, helps with the headache pain.   Is working part-time in [employer].  Has continued to have leg pain.  Has been making efforts to lose weight; by our records, her weight is down from 249lb in May 2011, to 242 today.  She is proud of this.   Still is not driving, under the advice of ophthalmology at [facility].  Husband Caitlin Clayton is driving her.   Habits & Providers  Alcohol-Tobacco-Diet     Tobacco Status: never  Allergies: 1)  ! Tramadol Hcl (Tramadol Hcl)  Family History: Reviewed history from 12/01/2009 and no changes required. Maternal grandmother with MI in her  94s.  Neither parent with MI.  No DM in family. No forms of cancer in family.  December 01, 2009: Son with syncopal episodes, getting workup with pedi cardiology  Social History: Reviewed history from 12/01/2009 and no changes required. Married.  Has three children at home. Husband out of work. Works at [employer], on her feet for 4 hrs at at time.  Never smoked, no alcohol or recreational drugs.   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  December 01, 2009: Working at [employer]. Doesn't collect a paycheck when school is out (June 30 to end August). Concerned about finances. Son still getting workup for syncope.  Physical Exam  General:  well appearing, in no acute distress. Eyes:  Round, mid-sized, react symmetrically to light.  EOMI. Uncorrected Snellen 20/40 left, 20/30 right. Ears:  External ear exam shows no significant lesions or deformities.  Otoscopic examination reveals clear canals, tympanic membranes are intact bilaterally without bulging, retraction, inflammation or discharge. Hearing is grossly normal bilaterally. Mouth:  Oral mucosa and oropharynx without lesions or exudates.  Teeth in good repair. Neck:  supple, nontender. no adenopathy noted.  Lungs:  Normal respiratory effort, chest expands symmetrically. Lungs are clear to auscultation, no crackles or wheezes. Heart:  Normal rate and regular rhythm. S1 and S2 normal without  gallop, murmur, click, rub or other extra sounds.   Impression & Recommendations:  Problem # 1:  PSEUDOTUMOR CEREBRI (ICD-348.2)  Worsening headaches.  Similar to her previous ones.  I reviewed Neurosurgery notes from visit on Sept 16th, and I am encouraging Caitlin Clayton to move her appt up with neurosurgery for evaluation.    Orders: [facility]- Est Level  3 ([address])  Problem # 2:  OBESITY, UNSPECIFIED (ICD-278.00)  Lost 7 lbs since May 2011.  Continue to encourage weight loss, as this will be helpful in management for pseudotumor cerebri.    Orders: [facility]- Est Level  3 ([address])  Complete Medication List: 1)  Estradiol 2 Mg Tabs (Estradiol) .Marland Kitchen.. 1 tab by mouth once daily 2)  Amitriptyline Hcl 75 Mg Tabs (Amitriptyline hcl) .... Sig: take 1 tab by mouth once daily 3)  Loratadine 10 Mg Tabs (Loratadine) .Marland Kitchen.. 1 by mouth one time daily 4)  Acetazolamide 250 Mg Tabs (Acetazolamide) .... 2 tabs by mouth twice daily 5)  Hydrocodone-acetaminophen 5-500 Mg Tabs (Hydrocodone-acetaminophen) .... Sig 1 tab by mouth every 8 hours as needed for extreme pain 6)  Miralax Powd (Polyethylene glycol 3350) .... Sig one heaping tablespoon in 8 oz water, by mouth one time daily disp 1 cannister 7)  Fluconazole 150 Mg Tabs (Fluconazole) .... Sig take 1 tab by mouth one time  [facility] [phone])  Patient Instructions: 1)  It was a pleasure to see you today.  I am sorry to hear that your headaches are back.   2)  I have given you another prescription for the Vicodin.  I will send the record of this visit to your neurosurgeon, Dr. Kelby Aline.  Also, I have refilled your Diamox and other medications as we discussed.  Prescriptions: HYDROCODONE-ACETAMINOPHEN 5-500 MG TABS (HYDROCODONE-ACETAMINOPHEN) SIG 1 tab by mouth every 8 hours as needed for extreme pain  #90 x 0   Entered and Authorized by:   Paula Compton MD   Signed by:   Paula Compton MD on 06/04/2010   Method used:   Print then Give to Patient   RxID:   [id number] MIRALAX  POWD (POLYETHYLENE GLYCOL 3350) SIG One heaping tablespoon in 8 oz water, by mouth one time daily DISP 1 cannister  #1 x 6   Entered and Authorized by:   Paula Compton MD   Signed by:   Paula Compton MD on 06/04/2010   Method used:   Electronically to        [facility] [phone]* (retail)       [address]       [place], [place]  [address]       Ph: [phone]       Fax: [phone]   RxID:   [id number] ESTRADIOL 2 MG TABS (ESTRADIOL) 1 tab by mouth once daily  #30 x 11   Entered and Authorized by:    Paula Compton MD   Signed by:   Paula Compton MD on 06/04/2010   Method used:   Electronically to        [facility] [phone]* (retail)       [address]       [place], [place]  [address]       Ph: [phone]       Fax: [phone]   RxID:   [id number] AMITRIPTYLINE HCL 75 MG TABS (AMITRIPTYLINE HCL) SIG: Take 1 tab by mouth once daily  #90 x 4   Entered and  Authorized by:   Paula Compton MD   Signed by:   Paula Compton MD on 06/04/2010   Method used:   Electronically to        [facility] [phone]* (retail)       [address]       [place], [place]  [address]       Ph: [phone]       Fax: [phone]   RxID:   [id number]    Orders Added: 1)  [facility]- Est Level  3 [[address]] 2)  [facility] [[id number]]  Appended Document: f/u bmc Faxed to Dr. Kelby Aline at [facility]

## 2010-09-14 NOTE — Letter (Signed)
Summary: Generic Letter  [facility]  [address]   [place], [place] [address]   Phone: [phone]  Fax: [phone]    12/02/2009  Caitlin Clayton [address] [place], [place]  [address]  Dear Ms. Gamino,   It was a pleasure to see you in the office yesterday.  I write with good news-- your liver studies are normal at this time.   I believe the reduced amount of acetaminophen (Tylenol) associated with the change in your pain medications will make liver injury less likely.          Sincerely,   Paula Compton MD  Appended Document: Generic Letter mailed.

## 2010-09-14 NOTE — Progress Notes (Signed)
Summary: phn msg  Phone Note Call from Patient Call back at Home Phone [phone] Call back at [phone]   Caller: Patient Summary of Call: pt is calling to want to know what to do now since neuorologist can't see her Initial call taken by: De Nurse,  October 16, 2009 2:57 PM  Follow-up for Phone Call        uses [facility] [address]. since there are no volunteer nurerology mds, she wants to know what to do next. told her pcp will return next week & perhaps he can rx something. I will call her when I hear back from him. she may need to make an appt Follow-up by: Golden Circle RN,  October 16, 2009 3:02 PM  Additional Follow-up for Phone Call Additional follow up Details #1::         called [facility] outpatient neurosurgery dept .  They will schedule appointment for patient but they require an MRI in past year . Dr. Mauricio Po notified and he will order. called patient and she states she had an MRI last year , not sure of date . she will find out and if within past year get the report sent to [place] before we schedule new MRI. 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  If no neurologist or neurosurgeon in the [facility] system can see her, then she should be referred to [facility] or [place]. Paula Compton MD  October 19, 2009 9:00 AM  I agree with tracking down the old MRI (within the past 1 yr) before re-ordering. If unable to be found, then agree with reordering. Paula Compton MD  October 19, 2009 2:54 PM   patient reports last MRI 2008. will need order for new MRI. Theresia Lo RN  October 19, 2009 3:45 PM  New MRI ordered, discussed with Myrlene Broker, RN. Paula Compton MD  October 20, 2009 12:19 PM

## 2010-09-14 NOTE — Consult Note (Signed)
Summary: [facility]   Imported By: Clydell Hakim 03/12/2010 15:16:53  _____________________________________________________________________  External Attachment:    Type:   Image     Comment:   External Document

## 2010-09-14 NOTE — Assessment & Plan Note (Signed)
Summary: CARPAL TUNNEL/KH   Vital Signs:  Patient profile:   35 year old female BP sitting:   136 / 87  Vitals Entered By: Lillia Pauls CMA (November 26, 2009 3:54 PM)  History of Present Illness: Pt presents as a consultation from Dr. Mauricio Po of the [facility] for bilateral hand numbness. 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 She now has constant numbness and tingling throughout her hands and has weakness as well. She cannot simply pick up a gallon of milk with either hand alone. She is right hand dominant. She wears splints at night which were originally helpful. However her most recent pair do not relieve her symptoms and she oftens wakes from sleep. Her symptoms are worse when she is trying to sleep, while driving or when talking on the phone. She has tried a home exercise program in addition to amitriptyline without any benefit. She has also had a short course of prednisone which helped briefly.   Allergies: 1)  ! Tramadol Hcl (Tramadol Hcl)  Physical Exam  General:  alert and well-developed.   Head:  normocephalic and atraumatic.   Neck:  supple.   Lungs:  normal respiratory effort.   Msk:  Bilateral Hands: Thenar atrophy bilaterally No bruising, edema or bony abnormalities No TTP along the carpal bones + Tinnel's bilaterally from the mid-wrist distally sending electric type shocks into her 3rd fingers + Phalen's bilaterally Full ROM of her bilateral wrists 4/5 grip strength bilaterally (cannot tell dominant hand because of weakness) Able to hold fingers apart against resistance Additional Exam:  U/S of her right median nerve showed an enlarged and edematous nerve measuring 0.33cm in circumference. The U/S evaluation of her left median nerve also showed edema and an enlarged median nerve with a circumference of 0.29cm. (Normal is 0.1cm.) Images saved for documentation.    Impression & Recommendations:  Problem # 1:  CARPAL TUNNEL SYNDROME, BILATERAL  (ICD-354.0)  Significant CTS since 1999 which would not likely improve with steroid injections alone  1. Will need a referral to an orthopedic hand surgeon for a carpal tunnel release as this will likely be the only to reverse some of her symptoms. She understood that her symptoms would require surgery and was interested in this definitive treatment.  2. Given two new cock-up wrist splints which were comfortable for her. Asked her to wear these at night as well as during activities that cause her CTS symptoms 3. Can try 100mg  of Vitamin B6 daily to help with her nerve pain. She can buy this over the counter.    Orders: [place] LIMITED ([address]) Brace [facility]rist [phone])  Complete Medication List: 1)  Hydrocodone-acetaminophen 5-500 Mg Tabs (Hydrocodone-acetaminophen) .... Sig: take 2 to 3 tabs by mouth every 12 hours as needed for extreme pain 2)  Estradiol 2 Mg Tabs (Estradiol) .Marland Kitchen.. 1 tab by mouth once daily 3)  Amitriptyline Hcl 75 Mg Tabs (Amitriptyline hcl) .... Sig: take 1 tab by mouth once daily  Patient Instructions: 1)  Please wear the splints as much as possible for your carpal tunnel syndrome of both wrists. You can wear these both at sleep and during the day. 2)  Put your hands in a bowl of warm water at the end of the day. 3)  Please take 100mg  of Vitamin B6 every day.  4)  We will let Dr. Mauricio Po know that you need a referral to an orthopedic surgeon to do a carpal tunnel release surgery on your  wrists.

## 2010-09-14 NOTE — Consult Note (Signed)
Summary: [facility]   Imported By: Clydell Hakim 03/23/2010 14:46:12  _____________________________________________________________________  External Attachment:    Type:   Image     Comment:   External Document

## 2010-09-14 NOTE — Consult Note (Signed)
Summary: [facility]   Imported By: Bradly Bienenstock 03/15/2010 12:20:27  _____________________________________________________________________  External Attachment:    Type:   Image     Comment:   External Document

## 2010-09-14 NOTE — Miscellaneous (Signed)
  Clinical Lists Changes  Medications: Removed medication of HYDROCODONE-ACETAMINOPHEN 5-500 MG TABS (HYDROCODONE-ACETAMINOPHEN) SIG 1 tab by mouth every 8 hours as needed for extreme pain Added new medication of PERCOCET 5-325 MG TABS (OXYCODONE-ACETAMINOPHEN) 1 to 2 tabs by mouth every 6 hours as needed for extreme pain - Signed Rx of PERCOCET 5-325 MG TABS (OXYCODONE-ACETAMINOPHEN) 1 to 2 tabs by mouth every 6 hours as needed for extreme pain;  #200 x 0;  Signed;  Entered by: Paula Compton MD;  Authorized by: Paula Compton MD;  Method used: Print then Give to Patient    Prescriptions: PERCOCET 5-325 MG TABS (OXYCODONE-ACETAMINOPHEN) 1 to 2 tabs by mouth every 6 hours as needed for extreme pain  #200 x 0   Entered and Authorized by:   Paula Compton MD   Signed by:   Paula Compton MD on 07/07/2010   Method used:   Print then Give to Patient   RxID:   [id number]

## 2010-09-14 NOTE — Consult Note (Signed)
Summary: [facility]- Neurosurgery  [facility]- Neurosurgery   Imported By: De Nurse 04/27/2010 12:17:04  _____________________________________________________________________  External Attachment:    Type:   Image     Comment:   External Document

## 2010-09-14 NOTE — Miscellaneous (Signed)
Summary: ROI  ROI   Imported By: Bradly Bienenstock 03/15/2010 12:28:34  _____________________________________________________________________  External Attachment:    Type:   Image     Comment:   External Document

## 2010-09-14 NOTE — Miscellaneous (Signed)
Summary: MRI approved  Clinical Lists Changes medsolutions approved MRI of brain #[id number].Marland KitchenGolden Circle RN  October 23, 2009 8:50 AM

## 2010-09-14 NOTE — Assessment & Plan Note (Signed)
Summary: FU/KH   Vital Signs:  Patient profile:   35 year old female Height:      62 inches Weight:      249 pounds BMI:     45.71 Temp:     98.1 degrees F oral Pulse rate:   68 / minute BP sitting:   128 / 80  (right arm) Cuff size:   large  Vitals Entered By: Tessie Fass CMA (Dec 25, 2009 8:36 AM) CC: F/U Is Patient Diabetic? No Pain Assessment Patient in pain? yes     Location: hands and legs Intensity: 8   CC:  F/U.  History of Present Illness: Caitlin Clayton is here for followup of several issues:  1) Pseudotumor cerebri:  Saw Neurosurgery at [facility] on the 10th, was told she would need a VP shunt. She continues to have HAs which are severe, now accompanied by nausea.  She is to go back to them on the 17th for a CT scan and another LP.  Is not looking forward to it, but knows she must go to save her vision.  She did not bring her disc with the MRI images to last visit, but is to bring on the coming visit.  2) LBP is getting worse.  Legs and Low back are a bit better after taking the percocet, which she takes as scheduled 2 tabs every 8 hrs.  Has enough to last until the 19th, when she is due.  Has many appts and is also working at [employer], makes driving to appts difficult.   3) Obesity.  Has gained back some weight.  Is concerned about thisl. Tries to drink water, but has slipped back to drinking soda and juice.  We have discussed ways to reduce caloric intake that are sustainable for her over time.   Habits & Providers  Alcohol-Tobacco-Diet     Tobacco Status: never  Current Medications (verified): 1)  Estradiol 2 Mg Tabs (Estradiol) .Marland Kitchen.. 1 Tab By Mouth Once Daily 2)  Amitriptyline Hcl 75 Mg Tabs (Amitriptyline Hcl) .... Sig: Take 1 Tab By Mouth Once Daily 3)  Percocet 5-325 Mg Tabs (Oxycodone-Acetaminophen) .... Sig: 1 To 2 Tabs By Mouth Every 8 Hours As Needed Extreme Pain 4)  Loratadine 10 Mg Tabs (Loratadine) .Marland Kitchen.. 1 By Mouth One Time Daily  Allergies  (verified): 1)  ! Tramadol Hcl (Tramadol Hcl)  Family History: Reviewed history from 12/01/2009 and no changes required. Maternal grandmother with MI in her 66s.  Neither parent with MI.  No DM in family. No forms of cancer in family.  December 01, 2009: Son with syncopal episodes, getting workup with pedi cardiology  Social History: Reviewed history from 12/01/2009 and no changes required. Married.  Has three children at home. Husband out of work. Works at [employer], on her feet for 4 hrs at at time.  Never smoked, no alcohol or recreational drugs.   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  December 01, 2009: Working at [employer]. Doesn't collect a paycheck when school is out (June 30 to end August). Concerned about finances. Son still getting workup for syncope.  Physical Exam  General:  well appearing, no apparent distress.  Mouth:  moist mucus membranes Neck:  Neck supple. no nodes Lungs:  Normal respiratory effort, chest expands symmetrically. Lungs are clear to auscultation, no crackles or wheezes. Heart:  Normal rate and regular rhythm. S1 and S2 normal without gallop, murmur, click, rub or other extra sounds. Pulses:  palpable  dp pulses.  Extremities:  no ankle edema noted.    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  She has another appt with them for May 17th, at which time she will get another LP and CT scan.  Records release from them (Dr. Ethelene Hal) completed today.   We have discussed her pain control; still having headaches.  Will continue with the percocet at the regimen she has been taking to date,  She has enough to last her another week and a half, but I will refill today (due on the 19th of each month) to avoid her need for yet another trip to pick up Rx next week.  She appreciates this, agrees to continue to take as  prescribed.  Orders: [facility]- Est  Level 4 (12458)  Problem # 2:  CARPAL TUNNEL SYNDROME, BILATERAL (ICD-354.0)  Has appt with [facility] on May 23rd, for eval. Copy of Dr. Darrick Penna' Sports Medicine consult visit given to patient, to present to her orthopedist at the time of her visit on the 23rd.    Orders: [facility]- Est  Level 4 (09983)  Problem # 3:  OBESITY, UNSPECIFIED (ICD-278.00)  Discussed importance of weight loss on her other chronic conditions (pseudotumor, leg/back pain).  She admits she has gone back to sodas and juices as her preferred drinks. Can tolerate diet Pepsi, may also try diet iced teas when she gets bored with water.  After she is feeling better, to attempt to increase activity more.   Orders: [facility]- Est  Level 4 (38250)  Complete Medication List: 1)  Estradiol 2 Mg Tabs (Estradiol) .Marland Kitchen.. 1 tab by mouth once daily 2)  Amitriptyline Hcl 75 Mg Tabs (Amitriptyline hcl) .... Sig: take 1 tab by mouth once daily 3)  Percocet 5-325 Mg Tabs (Oxycodone-acetaminophen) .... Sig: 1 to 2 tabs by mouth every 8 hours as needed extreme pain 4)  Loratadine 10 Mg Tabs (Loratadine) .Marland Kitchen.. 1 by mouth one time daily  Patient Instructions: 1)  It was a pleasure to see you today.  I am so glad that you have seen the neurosurgeons at [facility], and that you will be going back next Tuesday.  We are requesting records of your visits with them today. 2)  I am giving you a copy of your notes from Dr Darrick Penna to take to your orthopedics visit on May 23, for the bilateral carpal tunnel syndrome. 3)  I would like to see you back in mid-June, after you have seen both the Neurosurgeons and the Orthopedists again.  4)  I am refilling your Percocet a week early, to avoid your having to come in for the refill prescription again in 1 week. Please keep taking as directed.  Prescriptions: PERCOCET 5-325 MG TABS (OXYCODONE-ACETAMINOPHEN) SIG: 1 to 2 tabs by mouth every 8 hours as needed extreme pain  #180 x 0    Entered and Authorized by:   Paula Compton MD   Signed by:   Paula Compton MD on 12/25/2009   Method used:   Print then Give to Patient   RxID:   [id number]

## 2010-09-14 NOTE — Progress Notes (Signed)
Summary: Phn msg  Phone Note Call from Patient Call back at [phone] or [phone]   Caller: Patient Summary of Call: Would like to get the number to the nuerosurgeon where she is going tomorrow. Initial call taken by: Clydell Hakim,  Dec 21, 2009 9:07 AM  Follow-up for Phone Call        phone number given. Follow-up by: Theresia Lo RN,  Dec 21, 2009 10:25 AM

## 2010-09-14 NOTE — Assessment & Plan Note (Signed)
Summary: kh   History of Present Illness: Caitlin Clayton comes in today accompanying her husband for his visit.  She wishes to talk briefly about her headaches , which are not responding to the Vicodin. recently she saw her neurosurgeon Dr Kelby Aline at [facility], and she is scheduled for stenting on Dec 19th.  She is to start anticoagulants on Dec 5 and then go for preop evaluation on Dec 12th.  Today she wishes to talk about headaches.  Feels that the percocet had controlled her headache pain better.  Asks if appropriate to switch back.  Current Medications (verified): 1)  Estradiol 2 Mg Tabs (Estradiol) .Marland Kitchen.. 1 Tab By Mouth Once Daily 2)  Amitriptyline Hcl 75 Mg Tabs (Amitriptyline Hcl) .... Sig: Take 1 Tab By Mouth Once Daily 3)  Loratadine 10 Mg Tabs (Loratadine) .Marland Kitchen.. 1 By Mouth One Time Daily 4)  Acetazolamide 250 Mg Tabs (Acetazolamide) .... 2 Tabs By Mouth Twice Daily 5)  Hydrocodone-Acetaminophen 5-500 Mg Tabs (Hydrocodone-Acetaminophen) .... Sig 1 Tab By Mouth Every 8 Hours As Needed For Extreme Pain 6)  Miralax  Powd (Polyethylene Glycol 3350) .... Sig One Heaping Tablespoon in 8 Oz Water, By Mouth One Time Daily Disp 1 Cannister 7)  Fluconazole 150 Mg Tabs (Fluconazole) .... Sig Take 1 Tab By Mouth One Time 8)  Percocet 5-325 Mg Tabs (Oxycodone-Acetaminophen) .Marland Kitchen.. 1 To 2 Tabs By Mouth Every 6 Hours As Needed For Extreme Pain  Allergies (verified): 1)  ! Tramadol Hcl (Tramadol Hcl)  Social History: Married.  Has three children at home. Husband out of work. Works at [employer], on her feet for 4 hrs at at time.  Never smoked, no alcohol or recreational drugs.   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  December 01, 2009: Working at [employer]. Doesn't collect a paycheck when school is out (June 30 to end August). Concerned about finances. Son still getting workup for syncope.  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    Physical Exam  General:  well appearing, no  apparent distress Neck:  neck supple.    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  Is following with neurosurgeon.  Will change back to percocet, stop Vicodin.  A new script is given today.  She is to follow with Dr Kelby Aline as directed.   Orders: [facility]- Est Level  3 (03474)  Complete Medication List: 1)  Estradiol 2 Mg Tabs (Estradiol) .Marland Kitchen.. 1 tab by mouth once daily 2)  Amitriptyline Hcl 75 Mg Tabs (Amitriptyline hcl) .... Sig: take 1 tab by mouth once daily 3)  Loratadine 10 Mg Tabs (Loratadine) .Marland Kitchen.. 1 by mouth one time daily 4)  Acetazolamide 250 Mg Tabs (Acetazolamide) .... 2 tabs by mouth twice daily 5)  Miralax Powd (Polyethylene glycol 3350) .... Sig one heaping tablespoon in 8 oz water, by mouth one time daily disp 1 cannister 6)  Fluconazole 150 Mg Tabs (Fluconazole) .... Sig take 1 tab by mouth one time 7)  Percocet 5-325 Mg Tabs (Oxycodone-acetaminophen) .Marland Kitchen.. 1 to 2 tabs by mouth every 6 hours as needed for extreme pain   Orders Added: 1)  [facility]- Est Level  3 [25956]

## 2010-09-14 NOTE — Assessment & Plan Note (Signed)
Summary: f/u,df   Vital Signs:  Patient profile:   35 year old female Weight:      247 pounds Temp:     98.6 degrees F oral Pulse rate:   100 / minute BP sitting:   126 / 84  (left arm) Cuff size:   large  Vitals Entered By: Arlyss Repress CMA, (March 09, 2010 8:38 AM) CC: f/up procedure wed at [facility] Is Patient Diabetic? No Pain Assessment Patient in pain? yes     Location: right groin Intensity: 10 Onset of pain  x6d   CC:  f/up procedure wed at [facility].  History of Present Illness: Caitlin Clayton is here for followup.  She was seen at [facility] by her neurosurgeon Dr. Kelby Aline on 03/03/10, and she had some intervention that accessed her R inguinal area (no records available now, Jennalee is unclear of the procedure).  Since then she has had a lot of pain in the R inguinal area, worse with position changes and sitting on toilet to void.  Some change in her urine appearance, no true dysuria upon clarification.   Is scheduled to see her ophthalmologist Dr Izola Price at [facility] on 8/08, then to Dr. Kelby Aline on 8/23 for further evaluation.  She is to have MRV on Sept 2nd, and to determine then whether she will need a shunt.   Is taking diamox still.  Has some tingling in her feet, which she understands may be a side effect of the medication.  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  Waiting to see how the workup goes.   Her bilat leg pain and her headache are both better. Cockup splints for bilat carpal tunnel, this problem is taking a back seat to her pseudotumor workup at this point.   Habits & Providers  Alcohol-Tobacco-Diet     Tobacco Status: never  Current Medications (verified): 1)  Estradiol 2 Mg Tabs (Estradiol) .Marland Kitchen.. 1 Tab By Mouth Once Daily 2)  Amitriptyline Hcl 75 Mg Tabs (Amitriptyline Hcl) .... Sig: Take 1 Tab By Mouth Once Daily 3)  Loratadine 10 Mg Tabs (Loratadine) .Marland Kitchen.. 1 By Mouth One Time Daily 4)  Acetazolamide 250 Mg Tabs (Acetazolamide) .... 2 Tabs By  Mouth Twice Daily 5)  Hydrocodone-Acetaminophen 5-500 Mg Tabs (Hydrocodone-Acetaminophen) .... Sig 1 Tab By Mouth Every 8 Hours As Needed For Extreme Pain 6)  Miralax  Powd (Polyethylene Glycol 3350) .... Sig One Heaping Tablespoon in 8 Oz Water, By Mouth One Time Daily Disp 1 Cannister 7)  Fluconazole 150 Mg Tabs (Fluconazole) .... Sig Take 1 Tab By Mouth One Time  Allergies (verified): 1)  ! Tramadol Hcl (Tramadol Hcl)  Physical Exam  General:  Generally well.  Walks independently with cane.  No apparent distress except with lifting R leg.  Abdomen:  soft, nontender.  Pain to palpate along R inguinal area, no masses noted. no skin discoloration or ecchymosis. Msk:  Pain in R inguinal area with straight let raise on R.    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  Sees neurosurgery again on Aug 23 and again on Sept 2nd. Continuing Diamox.  Orders: Ultrasound (Ultrasound) [facility]- Est  Level 4 (04540)  Problem # 2:  INGUINAL PAIN, RIGHT (ICD-789.09) Pain temporally associated with procedure from last week.  For US doppler to rule out fistulization, extravasation.  Changed pain regimen to hydrocodone, as she reports more effective.  Having constipation, to treat with miralax and colace.  The following medications were removed from the medication list:    Percocet 5-325 Mg Tabs (Oxycodone-acetaminophen) ..... Sig: 1 to 2 tabs by mouth every 8 hours as needed extreme pain Her updated medication list for this problem includes:    Hydrocodone-acetaminophen 5-500 Mg Tabs (Hydrocodone-acetaminophen) ..... Sig 1 tab by mouth every 8 hours as needed for extreme pain  Orders: Ultrasound (Ultrasound) [facility]- Est  Level 4 (16109)  Problem # 3:  CONSTIPATION (ICD-564.00)  Colace and miralax.  Her updated medication list for this problem includes:    Miralax Powd (Polyethylene glycol 3350) ..... Sig one heaping tablespoon in 8 oz  water, by mouth one time daily disp 1 cannister  Orders: [facility]- Est  Level 4 (60454)  Problem # 4:  DYSURIA (ICD-788.1) For UA today; her complaint of pain with void seems more suspicious of R inguinal pain that is associated with positioning on toilet than true dysuria.  Orders: Urinalysis-[facility] (00000) [facility]- Est  Level 4 (09811)  Complete Medication List: 1)  Estradiol 2 Mg Tabs (Estradiol) .Marland Kitchen.. 1 tab by mouth once daily 2)  Amitriptyline Hcl 75 Mg Tabs (Amitriptyline hcl) .... Sig: take 1 tab by mouth once daily 3)  Loratadine 10 Mg Tabs (Loratadine) .Marland Kitchen.. 1 by mouth one time daily 4)  Acetazolamide 250 Mg Tabs (Acetazolamide) .... 2 tabs by mouth twice daily 5)  Hydrocodone-acetaminophen 5-500 Mg Tabs (Hydrocodone-acetaminophen) .... Sig 1 tab by mouth every 8 hours as needed for extreme pain 6)  Miralax Powd (Polyethylene glycol 3350) .... Sig one heaping tablespoon in 8 oz water, by mouth one time daily disp 1 cannister 7)  Fluconazole 150 Mg Tabs (Fluconazole) .... Sig take 1 tab by mouth one time  Patient Instructions: 1)  It was a pleasure to see you today.  We will request records from Dr. Tami Ribas at [facility].  2)  I am ordering an ultrasound of the right groin to evaluate your pain.  3)  I am changing your pain medicine to Vicodin, take 1 tab every 8 hours as needed.  Be sure to continue the colace one time daily, as well as the Miralax 1 tbsp in a glass of water once daily for constipation.  4)  I would like to see you back after your next series of tests at [facility] in early September [facility] FOLLOWUP 5-6 weeks) Prescriptions: FLUCONAZOLE 150 MG TABS (FLUCONAZOLE) SIG Take 1 tab by mouth one time  #1 x 0   Entered and Authorized by:   Paula Compton MD   Signed by:   Paula Compton MD on 03/09/2010   Method used:   Print then Give to Patient   RxID:   [id number] MIRALAX  POWD (POLYETHYLENE GLYCOL 3350) SIG One heaping tablespoon in 8 oz water, by  mouth one time daily DISP 1 cannister  #1 x 6   Entered and Authorized by:   Paula Compton MD   Signed by:   Paula Compton MD on 03/09/2010   Method used:   Print then Give to Patient   RxID:   [id number] HYDROCODONE-ACETAMINOPHEN 5-500 MG TABS (HYDROCODONE-ACETAMINOPHEN) SIG 1 tab by mouth every 8 hours as needed for extreme pain  #90 x 0   Entered and Authorized by:   Paula Compton MD   Signed by:   Paula Compton MD on 03/09/2010   Method used:   Print then Give to Patient   RxID:   [id number]   Laboratory Results   Urine Tests  Date/Time Received:  March 09, 2010 9:01 AM  Date/Time Reported: March 09, 2010 9:34 AM   Routine Urinalysis   Color: yellow Appearance: slightly Cloudy Glucose: negative   (Normal Range: Negative) Bilirubin: negative   (Normal Range: Negative) Ketone: negative   (Normal Range: Negative) Spec. Gravity: 1.020   (Normal Range: 1.003-1.035) Blood: negative   (Normal Range: Negative) pH: 7.0   (Normal Range: 5.0-8.0) Protein: negative   (Normal Range: Negative) Urobilinogen: 0.2   (Normal Range: 0-1) Nitrite: negative   (Normal Range: Negative) Leukocyte Esterace: moderate   (Normal Range: Negative)  Urine Microscopic WBC/HPF: 5-15 RBC/HPF: occ Bacteria/HPF: 3+ Epithelial/HPF: >20 Yeast/HPF: spores and hyphae present Other: few clue cells    Comments: ...............test performed by......Marland KitchenBonnie A. [place], MLS (ASCP)cm

## 2010-09-14 NOTE — Letter (Signed)
Summary: Generic Letter  [facility]  [address]   [place], [place] [address]   Phone: [phone]  Fax: [phone]    10/14/2009  Caitlin Clayton [address] [place], [place]  [address]  Dear Ms. Penado,   I hope this letter finds you well.  I write with excellent news about the breast ultrasound and mammogram that you had this week. Both tests are normal, and neither test finds any mass in your breasts.    I look forward to seeing you at your next visit.      Sincerely,   Paula Compton MD  Appended Document: Generic Letter mailed.

## 2010-09-14 NOTE — Assessment & Plan Note (Signed)
Summary: f/u eo   Vital Signs:  Patient profile:   35 year old female Height:      62 inches Weight:      247.7 pounds BMI:     45.47 Pulse rate:   75 / minute BP sitting:   117 / 80  (left arm) Cuff size:   large  Vitals Entered By: Arlyss Repress CMA, (November 06, 2009 3:16 PM) CC: discuss weight management. numbness and tingling in both hands. f/up last OV Is Patient Diabetic? No Pain Assessment Patient in pain? no        CC:  discuss weight management. numbness and tingling in both hands. f/up last OV.  History of Present Illness: Caitlin Clayton reports that she has several issues.  Her HAs are back.  We discussed findings of her recent MRI, and our office is arranging for Neurosurgery consult at [facility].    We discussed the importance of weight loss.  She has gained since last visit.  She admits to snacking, cookies at work at [employer], and [employer] and raisinettes at home before bed.  Drinks Pepsi-Cola daily.  Little physical activity. LIves across the street from a park.   Chronically fatigued.  GOes to sleep at 9pm with the help of amitriptyline, which she started in Sept 2010 around the time of her hysterectomy for cervical cancer.  Sleeps until 2-3am, when she wakes up suddenly and can't go back to sleep.  Husband says she snores.  Stays awake until she goes to work at [employer], then comes home at [employer].  Feels exhausted but does not sleep. Repeats cycle again.  Does not nap.   Very stressed, as her 71 yr old son with fainting spells, cardiologist and neurologist appts, EEG, and she is balancing her son's needs, her daughter's care, and her own medical issues.  Feels very stressed.   Habits & Providers  Alcohol-Tobacco-Diet     Tobacco Status: never  Allergies: 1)  ! Tramadol Hcl (Tramadol Hcl)  Family History: Reviewed history from 09/16/2009 and no changes required. Maternal grandmother with MI in her 66s.  Neither parent with MI.  No DM in family. No  forms of cancer in family.  Social History: Reviewed history from 10/09/2009 and no changes required. Married.  Has three children at home. Husband out of work. Works at [employer], on her feet for 4 hrs at at time.  Never smoked, no alcohol or recreational drugs.   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   Physical Exam  General:  Obese, no apparent distress.  Lungs:  Normal respiratory effort, chest expands symmetrically. Lungs are clear to auscultation, no crackles or wheezes. Heart:  Normal rate and regular rhythm. S1 and S2 normal without gallop, murmur, click, rub or other extra sounds. Extremities:  Positive Tinnels and Phalens bilaterally. Handgrip full and symmetric bilaterally. Palpable radial pulses.  Sensation grossly intact bilat hands. Brisk cap refills bilat hands. No wasting.    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  We are pursuing neurosurgery consult at [facility].  She continues to have headaches. Discussed importance of weight loss related to this.  She has tried Topamax, Diamox without relief of headache symptoms.    Orders: [facility]- Est  Level 4 ([address])  Problem # 2:  CARPAL TUNNEL SYNDROME, BILATERAL (ICD-354.0)  Limited improvement with short course oral steroids.  Not a  long term solution.  She has had cock-up splints that are not helping.  Will ask her to see Sports Medicine for injection if they feel this is appropriate.   Orders: [facility]- Est  Level 4 ([address])  Problem # 3:  OBESITY, UNSPECIFIED (ICD-278.00)  [name] is motivated to lose weight.  We discussed small, manageable changes she can make to reach attainable goals.  She vows to cut out at least one Pepsi cola daily, as well as to walk 20 minutes daily for at least 5 days a week, until her follow up in 1 month.   She describes difficulty initiating sleep, as well  as awakening at 3am and inability to fall asleep again.  Is chronically tired, husband has told her she snores.  I have a very strong suspicion for OSA, and we discussed why this is important.  Weight loss is critical to her improvement, and may help pseudotumor as well. To investigate dietician access through our [facility] office.   Orders: [facility]- Est  Level 4 ([address])  Complete Medication List: 1)  Hydrocodone-acetaminophen 5-500 Mg Tabs (Hydrocodone-acetaminophen) .... Sig: take 2 to 3 tabs by mouth every 12 hours as needed for extreme pain 2)  Estradiol 2 Mg Tabs (Estradiol) .Marland Kitchen.. 1 tab by mouth once daily 3)  Amitriptyline Hcl 75 Mg Tabs (Amitriptyline hcl) .... Sig: take 1 tab by mouth once daily  Patient Instructions: 1)  It was a pleasure to see you today.  2)  I would like to you see the Sports Medicine clinic at [facility] regarding your carpal tunnel syndrome, to consider steroid injections in both wrists. 3)  I would like you to cut out one can of Pepsi a day, and to walk 20 min five times a week until I see you next in 1 month.  Our goal is to lose one pound a week until then.  Keep a log of all the food you eat for 1 week between now and then. 4)  APPT IN 1 MONTH WITH DR Mauricio Po 5)  TELEPHONE FOR SPORTS MEDICINE CONSULT-- BILATERAL CARPAL TUNNEL, FOR CONSIDERATION INJECTIONS.

## 2010-09-14 NOTE — Miscellaneous (Signed)
  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 Prescott Parma, [facility]. s/p angioplasty 03/03/2010.

## 2010-09-14 NOTE — Progress Notes (Signed)
Summary: phn msg  Phone Note Call from Patient Call back at [phone]   Caller: Patient Summary of Call: pt is returning call Initial call taken by: De Nurse,  Dec 17, 2009 2:06 PM  Follow-up for Phone Call        Pt calling back and giving number she can be reached at [phone] Follow-up by: Clydell Hakim,  Dec 17, 2009 4:18 PM  Additional Follow-up for Phone Call Additional follow up Details #1::        patient notified of appointment  with ortho at [facility]. Additional Follow-up by: Theresia Lo RN,  Dec 17, 2009 4:26 PM

## 2010-09-14 NOTE — Consult Note (Signed)
Summary: [facility] Neurosurgery   Imported By: Clydell Hakim 03/23/2010 15:00:31  _____________________________________________________________________  External Attachment:    Type:   Image     Comment:   External Document

## 2010-09-14 NOTE — Assessment & Plan Note (Signed)
Summary: FU/KH   Vital Signs:  Patient profile:   35 year old female Height:      62 inches Weight:      243 pounds BMI:     44.61 Temp:     98.2 degrees F oral BP sitting:   128 / 86  (left arm) Cuff size:   large  Vitals Entered By: Tessie Fass CMA (December 01, 2009 8:32 AM) CC: F/U Is Patient Diabetic? No Pain Assessment Patient in pain? yes     Location: hands and head Intensity: 8   CC:  F/U.  History of Present Illness: Caitlin Clayton comes in today for follow up.  Her bilat carpal tunnel syndrome continues to bother her significantly, despite using cock-up splints all day and night.  Continues to work in [employer].  Was seen by Dr Darrick Penna in Sports Medicine, whos recommendation was for carpal tunnel release with orthopedics.  Continues with headaches.  Has appt with Neurosurgery for May 10th at [facility].  Is instructed to bring the CD_ROM of her MRI with her to the appointment. Pain control with vicodin, not as good as she'd like.  Had been on Percocet in the past, thinks it may have helped her then.  Has been taking vicodin 5/500, 1 tab every 4 to 6 hours (total max 3g acetaminophen/day by her count).  In the past, she had been treated with Diamox and Topamax unsuccessfully, by her neurologist in [facility].    Has been cutting down on snacking, despite increased anxiety.  Also, walking a little more outside.  Is glad to hear she's lost almost 5 lbs since last appt here 1 month ago.   Concern about her son, who has had several fainting spells and continues workup with pediatric cardiology.   Having significant allergy symptoms of rhinorrhea (clear), conjunctival irritation.  No cough, fevers, or shortness of breath.   Habits & Providers  Alcohol-Tobacco-Diet     Tobacco Status: never  Current Medications (verified): 1)  Estradiol 2 Mg Tabs (Estradiol) .Marland Kitchen.. 1 Tab By Mouth Once Daily 2)  Amitriptyline Hcl 75 Mg Tabs (Amitriptyline Hcl) .... Sig: Take 1 Tab By Mouth Once  Daily 3)  Percocet 5-325 Mg Tabs (Oxycodone-Acetaminophen) .... Sig: 1 To 2 Tabs By Mouth Every 8 Hours As Needed Extreme Pain 4)  Loratadine 10 Mg Tabs (Loratadine) .Marland Kitchen.. 1 By Mouth One Time Daily  Allergies (verified): 1)  ! Tramadol Hcl (Tramadol Hcl)  Family History: Maternal grandmother with MI in her 40s.  Neither parent with MI.  No DM in family. No forms of cancer in family.  December 01, 2009: Son with syncopal episodes, getting workup with pedi cardiology  Social History: Married.  Has three children at home. Husband out of work. Works at [employer], on her feet for 4 hrs at at time.  Never smoked, no alcohol or recreational drugs.   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  December 01, 2009: Working at [employer]. Doesn't collect a paycheck when school is out (June 30 to end August). Concerned about finances. Son still getting workup for syncope.  Physical Exam  General:  well appearing. Gait unremarkable. No apparent distress. Wearing cock-up splints. Eyes:  EOMI, PERRL.  Ears:  TMs clear bilaterally.  Nose:  bluish hue to nasal mucosa. Thin watery rhinorrhea Mouth:  clear oropharynx, moist mucus membranes Neck:  neck supple. No anterior cervical adenopathy Lungs:  Normal respiratory effort, chest expands symmetrically. Lungs are clear to auscultation, no crackles  or wheezes. Heart:  Normal rate and regular rhythm. S1 and S2 normal without gallop, murmur, click, rub or other extra sounds. Pulses:  palpable radial pulses bilat. Extremities:  Cock-up splints in place bilaterally. 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 She is to bring her MRI images on disk.   She is due for her vicodin Rx today.  She prefers to try the percocets instead, and I am giving her a Rx for this.  She will receive less tylenol if taken as prescribed.  We discussed  this. To check transaminases with CMet today given longstanding tylenol use as part of the VIcodin.   Orders: [facility]- Est  Level 4 (87564)  Problem # 2:  OBESITY, UNSPECIFIED (ICD-278.00) Proud to have lost 4-5 lbs in a month.  Discussed strategies to continue wtih weight loss. Continue to refrain from snacks, continue to walk and increase activity Orders: Comp Met-[facility] (33295-18841) [facility]- Est  Level 4 (66063)  Problem # 3:  CARPAL TUNNEL SYNDROME, BILATERAL (ICD-354.0) Seen by sports med, referral for ortho to consider carpal tunnel release.  Orders: Orthopedic Referral (Ortho) [facility]- Est  Level 4 (01601)  Complete Medication List: 1)  Estradiol 2 Mg Tabs (Estradiol) .Marland Kitchen.. 1 tab by mouth once daily 2)  Amitriptyline Hcl 75 Mg Tabs (Amitriptyline hcl) .... Sig: take 1 tab by mouth once daily 3)  Percocet 5-325 Mg Tabs (Oxycodone-acetaminophen) .... Sig: 1 to 2 tabs by mouth every 8 hours as needed extreme pain 4)  Loratadine 10 Mg Tabs (Loratadine) .Marland Kitchen.. 1 by mouth one time daily  Other Orders: Urinalysis-[facility] (00000) Prescriptions: LORATADINE 10 MG TABS (LORATADINE) 1 by mouth one time daily  #30 x 3   Entered and Authorized by:   Paula Compton MD   Signed by:   Paula Compton MD on 12/01/2009   Method used:   Print then Give to Patient   RxID:   [id number] PERCOCET 5-325 MG TABS (OXYCODONE-ACETAMINOPHEN) SIG: 1 to 2 tabs by mouth every 8 hours as needed extreme pain  #180 x 0   Entered and Authorized by:   Paula Compton MD   Signed by:   Paula Compton MD on 12/01/2009   Method used:   Print then Give to Patient   RxID:   [id number]   Laboratory Results   Urine Tests  Date/Time Received: December 01, 2009 9:14 AM  Date/Time Reported: 10:10 AM   Routine Urinalysis   Color: yellow Appearance: Clear Glucose: negative   (Normal Range: Negative) Bilirubin: negative   (Normal Range: Negative) Ketone: negative   (Normal Range: Negative) Spec. Gravity: 1.020   (Normal Range:  1.003-1.035) Blood: trace-intact   (Normal Range: Negative) pH: 6.0   (Normal Range: 5.0-8.0) Protein: negative   (Normal Range: Negative) Urobilinogen: 0.2   (Normal Range: 0-1) Nitrite: negative   (Normal Range: Negative) Leukocyte Esterace: negative   (Normal Range: Negative)  Urine Microscopic WBC/HPF: 0-3 RBC/HPF: 0-3 Bacteria/HPF: trace Mucous/HPF: 1+ Epithelial/HPF: 5-10 Crystals/HPF: none Casts/LPF: none Yeast/HPF: occ Other: few clue, occ sperm    Comments: Eustaquio Boyden  MD  December 01, 2009 10:10 AM

## 2010-09-14 NOTE — Letter (Signed)
Summary: Out of Work  [facility]  [address]   [place], [place] [address]   Phone: [phone]  Fax: [phone]    September 16, 2009   Employee:  Caitlin Clayton    To Whom It May Concern:   For Medical reasons, please excuse the above named employee from work for the following dates:    Sep 16, 2009    If you need additional information, please feel free to contact our office.         Sincerely,    Gladstone Pih

## 2010-09-14 NOTE — Progress Notes (Signed)
Summary: re: new pharmacy and refills/ts  Phone Note Call from Patient   Caller: Patient Call For: [phone] Summary of Call: Pt wanted to see if you could re call her rx to [facility] on [address] in [place] because they are cheaper than [facility].  Their number is [phone].  She said she didn't pick up the rxs called into [facility].  Please have someone call me when it's been sent. Initial call taken by: Abundio Miu,  June 07, 2010 9:48 AM  Follow-up for Phone Call        called pt and advised to [facility] and have her rx's transferred to her new pharmacy. i will change her pharmacy in her chart for the next refills. pt agreed. Follow-up by: Arlyss Repress CMA,,  June 08, 2010 11:41 AM

## 2010-09-14 NOTE — Consult Note (Signed)
Summary: [facility] - neurosurgery  [facility] - neurosurgery   Imported By: De Nurse 07/21/2010 16:26:03  _____________________________________________________________________  External Attachment:    Type:   Image     Comment:   External Document

## 2010-09-14 NOTE — Miscellaneous (Signed)
[facility]  [facility]   Imported By: Clydell Hakim 09/30/2009 13:54:56  _____________________________________________________________________  External Attachment:    Type:   Image     Comment:   External Document

## 2010-09-14 NOTE — Letter (Signed)
Summary: [facility]  [facility]   Imported By: Clydell Hakim 09/30/2009 13:52:09  _____________________________________________________________________  External Attachment:    Type:   Image     Comment:   External Document

## 2010-09-16 NOTE — Consult Note (Signed)
Summary: [facility]- Neurosurgery  [facility]- Neurosurgery   Imported By: De Nurse 08/10/2010 12:05:08  _____________________________________________________________________  External Attachment:    Type:   Image     Comment:   External Document

## 2010-09-16 NOTE — Consult Note (Signed)
Summary: [facility]- Discharge Summary  [facility]- Discharge Summary   Imported By: De Nurse 09/01/2010 11:57:13  _____________________________________________________________________  External Attachment:    Type:   Image     Comment:   External Document

## 2010-09-16 NOTE — Consult Note (Signed)
Summary: [facility]- Neurosurgery  [facility]- Neurosurgery   Imported By: De Nurse 08/10/2010 12:04:12  _____________________________________________________________________  External Attachment:    Type:   Image     Comment:   External Document

## 2010-09-16 NOTE — Consult Note (Signed)
Summary: [facility]- Neurosurgery  [facility]- Neurosurgery   Imported By: De Nurse 08/10/2010 12:04:45  _____________________________________________________________________  External Attachment:    Type:   Image     Comment:   External Document

## 2010-10-06 ENCOUNTER — Ambulatory Visit: Payer: Medicaid Other | Attending: Psychiatry | Admitting: Speech Pathology

## 2010-10-06 ENCOUNTER — Ambulatory Visit: Payer: Medicaid Other | Admitting: Occupational Therapy

## 2010-10-06 DIAGNOSIS — R4701 Aphasia: Secondary | ICD-10-CM | POA: Insufficient documentation

## 2010-10-06 DIAGNOSIS — M6281 Muscle weakness (generalized): Secondary | ICD-10-CM | POA: Insufficient documentation

## 2010-10-06 DIAGNOSIS — Z5189 Encounter for other specified aftercare: Secondary | ICD-10-CM | POA: Insufficient documentation

## 2010-10-06 DIAGNOSIS — R4189 Other symptoms and signs involving cognitive functions and awareness: Secondary | ICD-10-CM | POA: Insufficient documentation

## 2010-10-06 DIAGNOSIS — R269 Unspecified abnormalities of gait and mobility: Secondary | ICD-10-CM | POA: Insufficient documentation

## 2010-10-07 ENCOUNTER — Ambulatory Visit: Payer: Medicaid Other | Attending: Psychiatry | Admitting: Physical Therapy

## 2010-10-07 DIAGNOSIS — Z5189 Encounter for other specified aftercare: Secondary | ICD-10-CM | POA: Insufficient documentation

## 2010-10-07 DIAGNOSIS — R4189 Other symptoms and signs involving cognitive functions and awareness: Secondary | ICD-10-CM | POA: Insufficient documentation

## 2010-10-07 DIAGNOSIS — R269 Unspecified abnormalities of gait and mobility: Secondary | ICD-10-CM | POA: Insufficient documentation

## 2010-10-07 DIAGNOSIS — R4701 Aphasia: Secondary | ICD-10-CM | POA: Insufficient documentation

## 2010-10-07 DIAGNOSIS — M6281 Muscle weakness (generalized): Secondary | ICD-10-CM | POA: Insufficient documentation

## 2010-10-11 ENCOUNTER — Ambulatory Visit: Payer: Medicaid Other | Admitting: Physical Therapy

## 2010-10-11 ENCOUNTER — Ambulatory Visit: Payer: Medicaid Other

## 2010-10-13 ENCOUNTER — Encounter: Payer: Self-pay | Admitting: Occupational Therapy

## 2010-10-13 ENCOUNTER — Ambulatory Visit: Payer: Self-pay | Admitting: Physical Therapy

## 2010-10-14 ENCOUNTER — Ambulatory Visit: Payer: Medicaid Other | Attending: Psychiatry | Admitting: Physical Therapy

## 2010-10-14 ENCOUNTER — Ambulatory Visit: Payer: Medicaid Other | Admitting: Physical Therapy

## 2010-10-14 DIAGNOSIS — Z5189 Encounter for other specified aftercare: Secondary | ICD-10-CM | POA: Insufficient documentation

## 2010-10-14 DIAGNOSIS — R4189 Other symptoms and signs involving cognitive functions and awareness: Secondary | ICD-10-CM | POA: Insufficient documentation

## 2010-10-14 DIAGNOSIS — R269 Unspecified abnormalities of gait and mobility: Secondary | ICD-10-CM | POA: Insufficient documentation

## 2010-10-14 DIAGNOSIS — M6281 Muscle weakness (generalized): Secondary | ICD-10-CM | POA: Insufficient documentation

## 2010-10-14 DIAGNOSIS — R4701 Aphasia: Secondary | ICD-10-CM | POA: Insufficient documentation

## 2010-10-15 ENCOUNTER — Ambulatory Visit: Payer: Medicaid Other

## 2010-10-15 ENCOUNTER — Ambulatory Visit: Payer: Self-pay | Admitting: Family Medicine

## 2010-10-19 ENCOUNTER — Ambulatory Visit: Payer: Medicaid Other | Admitting: Physical Therapy

## 2010-10-19 ENCOUNTER — Ambulatory Visit: Payer: Medicaid Other | Admitting: Occupational Therapy

## 2010-10-19 ENCOUNTER — Ambulatory Visit: Payer: Medicaid Other

## 2010-10-21 ENCOUNTER — Ambulatory Visit: Payer: Medicaid Other | Admitting: Occupational Therapy

## 2010-10-21 ENCOUNTER — Ambulatory Visit: Payer: Medicaid Other | Admitting: Physical Therapy

## 2010-10-22 ENCOUNTER — Encounter: Payer: Self-pay | Admitting: Family Medicine

## 2010-10-22 ENCOUNTER — Ambulatory Visit (INDEPENDENT_AMBULATORY_CARE_PROVIDER_SITE_OTHER): Payer: Medicaid Other | Admitting: Family Medicine

## 2010-10-22 ENCOUNTER — Ambulatory Visit: Payer: Medicaid Other | Admitting: Family Medicine

## 2010-10-22 VITALS — BP 112/78 | Temp 98.3°F | Ht 62.0 in | Wt 212.0 lb

## 2010-10-22 DIAGNOSIS — E669 Obesity, unspecified: Secondary | ICD-10-CM

## 2010-10-22 DIAGNOSIS — Z8679 Personal history of other diseases of the circulatory system: Secondary | ICD-10-CM

## 2010-10-22 DIAGNOSIS — Z87828 Personal history of other (healed) physical injury and trauma: Secondary | ICD-10-CM

## 2010-10-22 DIAGNOSIS — R413 Other amnesia: Secondary | ICD-10-CM | POA: Insufficient documentation

## 2010-10-22 DIAGNOSIS — G932 Benign intracranial hypertension: Secondary | ICD-10-CM

## 2010-10-22 MED ORDER — AMPHETAMINE-DEXTROAMPHETAMINE 10 MG PO TABS: 10.0000 mg | ORAL_TABLET | Freq: Two times a day (BID) | ORAL | Status: DC

## 2010-10-22 MED ORDER — ASCRIPTIN 325 MG PO TABS: 1.0000 | ORAL_TABLET | Freq: Every day | ORAL | Status: AC

## 2010-10-22 MED ORDER — LEVETIRACETAM 1000 MG PO TABS: 1000.0000 mg | ORAL_TABLET | Freq: Two times a day (BID) | ORAL | Status: DC

## 2010-10-22 MED ORDER — BROMOCRIPTINE MESYLATE 5 MG PO CAPS: 5.0000 mg | ORAL_CAPSULE | Freq: Three times a day (TID) | ORAL | Status: DC

## 2010-10-22 NOTE — Assessment & Plan Note (Signed)
Has lost 30# through whole ordeal.  Discussed importance of weight reduction for overall health, pseudotumor cerebri.

## 2010-10-22 NOTE — Assessment & Plan Note (Addendum)
Caitlin Clayton continues to complain of headaches, especially around the site of the R hemicraniectomy where the helmet touches the scalp.  Requests more medications for pain.  Will contact her neurologist, Dr Westly Pam, in [facility] regarding advisability of change in analgesics (tylenol not helping her pain). Given her sleep disturbance and emotional lability, I wonder if use of mood stabilizing agent at bedtime might be useful in inducing nighttime sleep and reducing her tearfulness.  Will defer this decision to her neurologist, with whom she may make a followup appointment in the coming month.  She had follow up appt with Neurosurgery Dr Kelby Aline on March 30th.

## 2010-10-22 NOTE — Assessment & Plan Note (Addendum)
Emotional lability (cries a lot) and wanders at night looking for "baby" which she believes her husband delivered at home.  Not oriented to month or place (wonders why I've moved my office from [facility]; thinks it's February).  Husband Jillyn Hidden indicates that Medicaid has not approved her SLP sessions, nor her PT/OT sessions.  I will be glad to write a letter on Caitlin Clayton's behalf to support her access to these services. Will call Jillyn Hidden when the letter is complete.

## 2010-10-22 NOTE — Patient Instructions (Signed)
It was a pleasure to see you today. I will write a letter on your behalf to support your referral to SLP and physical therapy/occupational therapy.    I would like to see you back in another 6 to 8 weeks for follow up.

## 2010-10-22 NOTE — Progress Notes (Signed)
  Subjective:    Patient ID: Caitlin Clayton, female    DOB: May 08, 1976, 35 y.o.   MRN: [id number]  HPI Patient is here with her husband, Caitlin Clayton.  She is ambulating slowly using a rolling walker.  I have reviewed the DC Summary from [facility], for admission 08/02/10 to 08/25/10; subsequently was in [facility] for inpatient rehab until Feb 17.  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  She developed loss of motor control on the L, also required PEG tube for feeding.  She regained her swallow function and had PEG removed prior to her transfer to the [facility].    Presently, Caitlin Clayton complains of R sided headache at site where helmet touches R scalp.  Husband Caitlin Clayton remarks that she believes she is in [place]; has significant amnesia, can be redirected by then cannot retain/learn what she was told.  Emotional lability, cries a lot, especially at night.  Sleep is disrupted, is affecting her husband's routine as he tries to care for [place] and their children.    Review of Systems     Objective:   Physical Exam  Constitutional:       Alert, speaking clearly.  Emotional lability noted.   HENT:       Asymmetry; sunken scalp on R side.  Patient with helmet.   Eyes: Conjunctivae are normal. Pupils are equal, round, and reactive to light.  Neck: Normal range of motion. Neck supple.  Cardiovascular: Normal rate and regular rhythm.   Pulmonary/Chest: Effort normal and breath sounds normal. No respiratory distress. She has no wheezes. She has no rales.  Lymphadenopathy:    She has no cervical adenopathy.  Neurological:       Disoriented to place and date (believes it's Feb; surprised that it's March, then forgets in 5 minutes).  Emotionally labile; cries when talking about the "baby I carried in my womb and Caitlin Clayton delivered at home". Speech is fluid.  Handgrip 4/5 L, 5/5 R.  UE flexion at elbow and shoulder is weaker L than R (3/5  L, 5/5 R). Hip flexion symmetric and full.   Requires assistance to get up and down from exam table, and to get situated on Walker.  Gait slowed.   Psychiatric:       Emotional lability.  Perseverating on false ideas (ie, lost baby).  Appears to redirect, then forgets within minutes.           Assessment & Plan:

## 2010-10-26 ENCOUNTER — Encounter: Payer: Medicaid Other | Admitting: Occupational Therapy

## 2010-10-26 ENCOUNTER — Ambulatory Visit: Payer: Medicaid Other | Admitting: Physical Therapy

## 2010-10-27 ENCOUNTER — Ambulatory Visit: Payer: Medicaid Other | Admitting: Speech Pathology

## 2010-10-27 ENCOUNTER — Encounter: Payer: Medicaid Other | Admitting: Occupational Therapy

## 2010-10-27 ENCOUNTER — Ambulatory Visit: Payer: Medicaid Other | Admitting: Physical Therapy

## 2010-10-27 ENCOUNTER — Encounter: Payer: Medicaid Other | Admitting: Speech Pathology

## 2010-10-27 ENCOUNTER — Ambulatory Visit: Payer: Medicaid Other | Admitting: Occupational Therapy

## 2010-11-01 LAB — BASIC METABOLIC PANEL WITH GFR
BUN: 10 mg/dL (ref 6–23)
CO2: 27 meq/L (ref 19–32)
Calcium: 9.1 mg/dL (ref 8.4–10.5)
Chloride: 107 meq/L (ref 96–112)
Creatinine, Ser: 0.8 mg/dL (ref 0.4–1.2)
GFR calc Af Amer: >60 mL/min (ref >60)
GFR calc non Af Amer: >60 mL/min (ref >60)
Glucose, Bld: 91 mg/dL (ref 70–99)
Potassium: 4.2 meq/L (ref 3.5–5.1)
Sodium: 143 meq/L (ref 135–145)

## 2010-11-01 LAB — DIFFERENTIAL
Basophils Absolute: 0 10*3/uL (ref 0.0–0.1)
Basophils Relative: 0 % (ref 0–1)
Eosinophils Absolute: 0.1 10*3/uL (ref 0.0–0.7)
Eosinophils Relative: 1 % (ref 0–5)
Lymphs Abs: 4 10*3/uL (ref 0.7–4.0)
Neutro Abs: 7.6 10*3/uL (ref 1.7–7.7)
Neutrophils Relative %: 62 % (ref 43–77)

## 2010-11-01 LAB — CBC
HCT: 39 % (ref 36.0–46.0)
MCHC: 32.6 g/dL (ref 30.0–36.0)
MCV: 94.6 fL (ref 78.0–100.0)
Platelets: 387 10*3/uL (ref 150–400)
WBC: 12.3 10*3/uL — ABNORMAL HIGH (ref 4.0–10.5)

## 2010-11-02 ENCOUNTER — Ambulatory Visit: Payer: Medicaid Other

## 2010-11-02 ENCOUNTER — Ambulatory Visit: Payer: Medicaid Other | Admitting: Physical Therapy

## 2010-11-02 ENCOUNTER — Ambulatory Visit: Payer: Medicaid Other | Admitting: Occupational Therapy

## 2010-11-04 ENCOUNTER — Telehealth: Payer: Self-pay | Admitting: Family Medicine

## 2010-11-04 MED ORDER — AMPHETAMINE-DEXTROAMPHETAMINE 10 MG PO TABS: 10.0000 mg | ORAL_TABLET | Freq: Two times a day (BID) | ORAL | Status: AC

## 2010-11-04 NOTE — Telephone Encounter (Signed)
Asking for refill for adderall.

## 2010-11-04 NOTE — Telephone Encounter (Signed)
Husband needs someone to call him to let him know the name of the blood thinner pt is on.

## 2010-11-04 NOTE — Telephone Encounter (Signed)
Prescription printed and left at front desk, please let patient know to pick up. Thanks.  JB

## 2010-11-05 ENCOUNTER — Telehealth: Payer: Self-pay | Admitting: Family Medicine

## 2010-11-05 ENCOUNTER — Ambulatory Visit: Payer: Medicaid Other | Admitting: Physical Therapy

## 2010-11-05 ENCOUNTER — Ambulatory Visit: Payer: Medicaid Other

## 2010-11-05 ENCOUNTER — Ambulatory Visit: Payer: Medicaid Other | Admitting: Occupational Therapy

## 2010-11-05 NOTE — Telephone Encounter (Signed)
Mr. Kuehnle need to know asap about the type of blood thinner meds given to his wife before her surgery.  Her employer said she had heard there had been a recall on a medicine for blood thinner and he wanted to make sure that wasn't what she was given.

## 2010-11-05 NOTE — Telephone Encounter (Signed)
Called pt's husband. Dr.Breen has never given any blood thinners to the pt. Not in chart. Maybe from the surgeon? Pt's husband will find out from Careers adviser. Arlyss Repress

## 2010-11-08 ENCOUNTER — Ambulatory Visit: Payer: Medicaid Other | Admitting: Occupational Therapy

## 2010-11-08 ENCOUNTER — Ambulatory Visit: Payer: Medicaid Other | Admitting: Physical Therapy

## 2010-11-11 ENCOUNTER — Ambulatory Visit: Payer: Medicaid Other | Admitting: Occupational Therapy

## 2010-11-11 ENCOUNTER — Ambulatory Visit: Payer: Medicaid Other | Admitting: Physical Therapy

## 2010-11-11 ENCOUNTER — Ambulatory Visit: Payer: Medicaid Other

## 2010-11-15 ENCOUNTER — Ambulatory Visit: Payer: Medicaid Other

## 2010-11-15 ENCOUNTER — Ambulatory Visit: Payer: Medicaid Other | Admitting: Occupational Therapy

## 2010-11-15 ENCOUNTER — Ambulatory Visit: Payer: Medicaid Other | Attending: Psychiatry | Admitting: Physical Therapy

## 2010-11-15 DIAGNOSIS — M6281 Muscle weakness (generalized): Secondary | ICD-10-CM | POA: Insufficient documentation

## 2010-11-15 DIAGNOSIS — R4701 Aphasia: Secondary | ICD-10-CM | POA: Insufficient documentation

## 2010-11-15 DIAGNOSIS — Z5189 Encounter for other specified aftercare: Secondary | ICD-10-CM | POA: Insufficient documentation

## 2010-11-15 DIAGNOSIS — R4189 Other symptoms and signs involving cognitive functions and awareness: Secondary | ICD-10-CM | POA: Insufficient documentation

## 2010-11-15 DIAGNOSIS — R269 Unspecified abnormalities of gait and mobility: Secondary | ICD-10-CM | POA: Insufficient documentation

## 2010-11-17 ENCOUNTER — Ambulatory Visit: Payer: Medicaid Other | Admitting: Speech Pathology

## 2010-11-17 ENCOUNTER — Encounter: Payer: Medicaid Other | Admitting: Occupational Therapy

## 2010-11-17 ENCOUNTER — Ambulatory Visit: Payer: Medicaid Other | Admitting: Physical Therapy

## 2010-11-18 LAB — POCT URINALYSIS DIP (DEVICE)
Bilirubin Urine: NEGATIVE
Ketones, ur: NEGATIVE mg/dL
Nitrite: NEGATIVE
Protein, ur: NEGATIVE mg/dL
pH: 5 (ref 5.0–8.0)

## 2010-11-19 LAB — TYPE AND SCREEN: ABO/RH(D): O POS

## 2010-11-19 LAB — CBC
HCT: 36.1 % (ref 36.0–46.0)
Hemoglobin: 12.5 g/dL (ref 12.0–15.0)
MCHC: 33.3 g/dL (ref 30.0–36.0)
MCHC: 33.7 g/dL (ref 30.0–36.0)
Platelets: 347 10*3/uL (ref 150–400)
RBC: 3.93 MIL/uL (ref 3.87–5.11)
RDW: 13.3 % (ref 11.5–15.5)
RDW: 13.6 % (ref 11.5–15.5)
WBC: 8.7 10*3/uL (ref 4.0–10.5)

## 2010-11-19 LAB — BASIC METABOLIC PANEL
CO2: 29 mEq/L (ref 19–32)
Chloride: 105 mEq/L (ref 96–112)
Potassium: 4.7 mEq/L (ref 3.5–5.1)
Sodium: 138 mEq/L (ref 135–145)

## 2010-11-19 LAB — ABO/RH: ABO/RH(D): O POS

## 2010-11-22 ENCOUNTER — Encounter: Payer: Medicaid Other | Admitting: Speech Pathology

## 2010-11-22 ENCOUNTER — Ambulatory Visit: Payer: Medicaid Other | Admitting: Physical Therapy

## 2010-11-22 ENCOUNTER — Encounter: Payer: Medicaid Other | Admitting: Occupational Therapy

## 2010-11-25 LAB — CBC
Hemoglobin: 12.9 g/dL (ref 12.0–15.0)
MCHC: 33.3 g/dL (ref 30.0–36.0)
RBC: 4.12 MIL/uL (ref 3.87–5.11)
WBC: 10.4 10*3/uL (ref 4.0–10.5)

## 2010-11-25 LAB — COMPREHENSIVE METABOLIC PANEL
ALT: 11 U/L (ref 0–35)
AST: 20 U/L (ref 0–37)
Alkaline Phosphatase: 58 U/L (ref 39–117)
CO2: 26 mEq/L (ref 19–32)
Calcium: 9.1 mg/dL (ref 8.4–10.5)
Chloride: 104 mEq/L (ref 96–112)
GFR calc Af Amer: >60 mL/min (ref >60)
GFR calc non Af Amer: >60 mL/min (ref >60)
Glucose, Bld: 97 mg/dL (ref 70–99)
Potassium: 4.3 mEq/L (ref 3.5–5.1)
Sodium: 140 mEq/L (ref 135–145)
Total Bilirubin: 0.3 mg/dL (ref 0.3–1.2)

## 2010-11-25 LAB — URINALYSIS, ROUTINE W REFLEX MICROSCOPIC
Bilirubin Urine: NEGATIVE
Nitrite: NEGATIVE
Specific Gravity, Urine: 1.01 (ref 1.005–1.030)
pH: 7 (ref 5.0–8.0)

## 2010-11-25 LAB — DIFFERENTIAL
Basophils Absolute: 0.1 10*3/uL (ref 0.0–0.1)
Basophils Relative: 1 % (ref 0–1)
Eosinophils Absolute: 0 10*3/uL (ref 0.0–0.7)
Eosinophils Relative: 0 % (ref 0–5)
Neutrophils Relative %: 65 % (ref 43–77)

## 2010-12-01 ENCOUNTER — Ambulatory Visit (INDEPENDENT_AMBULATORY_CARE_PROVIDER_SITE_OTHER): Payer: Medicaid Other | Admitting: Family Medicine

## 2010-12-01 ENCOUNTER — Encounter: Payer: Self-pay | Admitting: Family Medicine

## 2010-12-01 VITALS — BP 112/76 | HR 77 | Temp 98.1°F | Ht 62.0 in | Wt 195.0 lb

## 2010-12-01 DIAGNOSIS — G932 Benign intracranial hypertension: Secondary | ICD-10-CM

## 2010-12-01 DIAGNOSIS — R413 Other amnesia: Secondary | ICD-10-CM

## 2010-12-01 MED ORDER — BROMOCRIPTINE MESYLATE 5 MG PO CAPS: 5.0000 mg | ORAL_CAPSULE | Freq: Three times a day (TID) | ORAL | Status: DC

## 2010-12-01 MED ORDER — ONDANSETRON 4 MG PO TBDP: ORAL_TABLET | ORAL | Status: DC

## 2010-12-01 MED ORDER — ACETAZOLAMIDE 250 MG PO TABS: 500.0000 mg | ORAL_TABLET | Freq: Two times a day (BID) | ORAL | Status: DC

## 2010-12-01 MED ORDER — LEVETIRACETAM 1000 MG PO TABS: 1000.0000 mg | ORAL_TABLET | Freq: Two times a day (BID) | ORAL | Status: DC

## 2010-12-01 NOTE — Patient Instructions (Signed)
It was a pleasure to see you again.  I recommend Colace as a stool softener if you are having trouble going to the bathroom.   I have sent a prescription for an anti-nausea medicine, Zofran 4mg , which you should only use if you have recurrent vomiting that does not allow you to take your medicines.    As Dr. Wayne Sever office has said, please stop your aspirin on 4/19 (one week before your surgery, scheduled for 4/26).  I would like to see you back after your follow-up appointment with Dr Kelby Aline.

## 2010-12-01 NOTE — Assessment & Plan Note (Signed)
Appears more alert and oriented than previous.

## 2010-12-01 NOTE — Progress Notes (Signed)
  Subjective:    Patient ID: Caitlin Clayton, female    DOB: 11-Jul-1976, 35 y.o.   MRN: [id number]  HPI Caitlin Clayton comes in today with her husband Caitlin Clayton.  She has had continued headaches since last visit.  Was seen by Neurosurgery on 3/30 and again yesterday for a preop visit to replace the bone flap after her R hemicraniectomy which was performed to evacuate a subdural hematoma, which in turn occurred after brain angioplasty with stent placement for pseudotumor cerebri.   She continues to complain of pain, tylenol not helping.  Neurosurgery plans for replacement of skull flap on April 26th.  She is currently wearing a helmet and reports a great deal of discomfort with this.   Caitlin Clayton states that [employer] still believes she has small babies at home.  Memory issues seem unchanged to him.  Her sister has moved in from [place], which is a big help to the family.  Still with L upper extremity weakness.    In the past 10 days she has had 1 to 2 emesis episodes per day. Usually first thing in the morning, then again in the late evening.  Home prepared food, such as rice, toast, chicken and broth.  Does tolerate some things (ginger ale).    Review of Systems     Objective:   Physical Exam    Alert, responds appropriately to questions   Oriented to place ("Dr Marinell Blight office"), [place], April 2012.  Neck supple.  Wearing helmet. Mucus membranes very moist.  Skin supple, no tenting,.  Handgrip L hand 3/5, R hand 5/5. Abd: Soft, nontender. nondistended Ambulating with walker.     Assessment & Plan:

## 2010-12-01 NOTE — Assessment & Plan Note (Signed)
Continues with headache pain, may be related to helmet from R hemicraniectomy.  Concern over giving sedating pain meds.  For surgical repair next week at [facility].  Continue current meds.

## 2010-12-07 ENCOUNTER — Telehealth: Payer: Self-pay | Admitting: Family Medicine

## 2010-12-07 NOTE — Telephone Encounter (Signed)
Needs refill on Adderall Please call when ready

## 2010-12-08 ENCOUNTER — Other Ambulatory Visit: Payer: Self-pay | Admitting: Family Medicine

## 2010-12-08 MED ORDER — AMPHETAMINE-DEXTROAMPHETAMINE 10 MG PO TABS: ORAL_TABLET | ORAL | Status: AC

## 2010-12-08 NOTE — Telephone Encounter (Signed)
Please let them know that the Rx is at the front desk; controlled substance that cannot be called in or sent by computer.  Thanks.  JB

## 2010-12-28 NOTE — Assessment & Plan Note (Signed)
Caitlin Clayton, Caitlin Clayton             ACCOUNT NO.:  [id number]   MEDICAL RECORD NO.:  [id number]          PATIENT TYPE:  WOC   LOCATION:  [facility] at [facility]         FACILITY:  [facility]   PHYSICIAN:  Sid Falcon, CNM  DATE OF BIRTH:  09-15-1975   DATE OF SERVICE:  01/15/2009                                  CLINIC NOTE   The patient is here for exam secondary to pelvic pain.   ALLERGIES:  No known drug allergies.   CURRENT MEDICATIONS:  None.   IMMUNIZATIONS:  Up to date with childhood immunizations.  Tetanus was in  1998.  Did not receive flu or pneumonia vaccinations.   MENSTRUAL HISTORY:  Menarche at 35 years of age.  No longer has cycles  due to a partial hysterectomy in 2006 secondary to endometriosis.   OBSTETRICAL HISTORY:  History of 8 pregnancies with 3 living children  with 2 miscarriages, 3 terminations, and 2 C-sections.   SURGICAL HISTORY:  Two C-sections in 2002 and 2003, tubal ligation in  2003, and partial hysterectomy in 2006.   FAMILY HISTORY:  Heart disease in grandmother, heart attack in  grandfather and grandmother, and high blood pressure in mother.   PERSONAL MEDICAL HISTORY:  Osteoarthritis diagnosed in 2002, spinal, and  pneumonia in 2009.   SOCIAL HISTORY:  Works part-time at [employer].  Denies smoking, alcohol consumption, or illicit drug use.  Denies  history of physical or sexual abuse.   REVIEW OF SYSTEMS:  The patient reports night sweats, hot flashes since  the partial hysterectomy, occasional numbness and weakness in fingers,  typical muscle aches as reported per the patient.  Occasional problems  with ears.  Pain that resolves on its own and problem with night vision.  The patient is also reporting vaginal odor and itching x2 weeks and  significant pain with intercourse for the past 3-4 months.  The patient  was seen at [facility] in early May, tests for Chlamydia and  gonorrhea negative, treated with  metronidazole for bacterial vaginosis.  Pelvic pain that is cyclic in nature, occurring every month.   PHYSICAL EXAMINATION:  GENERAL:  Alert and oriented x3, no sign of acute  acute distress.  PELVIC:  Tenderness palpated with vaginal cuff.  Pelvic exam with a  speculum, vaginal cuff closed.  White __________ discharge.  No abnormal  lesions.  Pelvis, tender with palpation bilaterally in the adnexa.   ASSESSMENT:  Pelvic pain.   PLAN:  Review with the patient endometriosis in that symptoms recur if  the ovaries are intact.  I have recommended Depo-Lupron.  The patient  denied, reported that that did not assist in the past, desires surgical  consult and prescription for diclofenac b.i.d. for pain, also discussed  that a Pap smear is not indicated secondary to removal of cervix with  hysterectomy.  We will obtain consent to receive records from [facility] and [facility].  The patient will follow up and schedule the surgical consult.      Sid Falcon, CNM     WM/MEDQ  D:  01/15/2009  T:  01/16/2009  Job:  [id number]

## 2010-12-28 NOTE — Group Therapy Note (Signed)
NAMETERRIS, Caitlin Clayton             ACCOUNT NO.:  [id number]   MEDICAL RECORD NO.:  [id number]          PATIENT TYPE:  WOC   [facility]                   FACILITY:  [facility]   PHYSICIAN:  Dorthula Perfect, MD     DATE OF BIRTH:  April 12, 1976   DATE OF SERVICE:  02/25/2009                                  CLINIC NOTE   This 35 year old African American female multigravid is seen today for  evaluation of pelvic pain and wanting surgery.  In 2001, because of  pelvic pain, she had laparoscopy performed elsewhere (records are in the  chart).  At that time, endometriosis was identified and she had laser  ablation.  In 2003, she had a tubal procedure.  In 2006, because of  diagnoses of endometriosis and pelvic pain, she underwent a laparoscopic  hysterectomy.  The operative note is in the chart.  She apparently had  dense uterine adhesions to the anterior abdominal wall and the omentum.  The pathology did not show any endometriosis.  For approximately 2  years, she was pain free.  Then, after that she began having low  abdominal pain.  Since December 2009, she has had low abdominal pain  pretty much on a daily basis.  Her frequency of sexual intercourse has  been markedly decreased because of severe dyspareunia.  To complicate  matters, she does have intermittent constipation and can go as long as a  week and half without a bowel movement.  It sounds as if she has a bowel  movement that the hurting is diminished little bit, but does not go  weight.  Pain is described as pretty much there all the time and has  also sometimes crampy.  She is tired of all this pain.   OTHER PAST SURGERIES:  History of 2 cesarean sections.   MEDICATIONS:  None.   REVIEW OF SYSTEMS:  The patient is in good health other than this  ongoing symptomatic problem.   PHYSICAL EXAMINATION:  Height 5 feet, weight 221, blood pressure 119/81.  Her abdomen is moderately obese and there is low abdominal tenderness.  There is no guarding or rebound.  Pelvic exam, external genitalia, BUS  glands were normal.  Vaginal vault was negative.  Vaginal cuff was  negative.  Digital exam revealed both anterior and posterior cul-de-sac  tenderness in the midline.  I could not palpate either ovary because of  her size and tenderness.  No masses were noted.  I think most of her  tenderness was involving anterior and posterior cul-de-sac.   IMPRESSION:  Pelvic pain, possibly secondary to persistent  endometriosis.   DISPOSITION:  She will return to see one of the GYN surgical people.  She is probably going to need diagnostic laparoscopy and I explained to  her she may indeed have to have both of her tubes and ovaries removed,  which she understands and accepts.            ______________________________  Dorthula Perfect, MD    ER/MEDQ  D:  02/25/2009  T:  02/26/2009  Job:  [id number]

## 2010-12-28 NOTE — Group Therapy Note (Signed)
Caitlin Clayton, Caitlin Clayton             ACCOUNT NO.:  [id number]   MEDICAL RECORD NO.:  [id number]          PATIENT TYPE:  WOC   [facility]                   FACILITY:  [facility]   PHYSICIAN:  Jaynie Collins, MD     DATE OF BIRTH:  09/30/1975   DATE OF SERVICE:                                  CLINIC NOTE   CHIEF COMPLAINT:  Surgical evaluation.   HISTORY OF PRESENT ILLNESS:  The patient is a 35 year old gravida 8,  para 3-0-5-3 with a history of endometriosis as per the patient and  history of two prior cesarean sections who also underwent a laparoscopic  hysterectomy in 2008 remarkable for dense uterine adhesions, anterior  abdominal wall, and omentum.  Of note, the pathology from the  laparoscopic hysterectomy did not show any endometriosis.  The patient  was seen on February 25, 2009, at which point, she complained about pain  since December which is reminiscent of the pain that she had in 2001  when her endometriosis was identified.  At that point, she did undergo  laser ablation.  This patient is convinced that her pain which is  constant pelvic pain and also dyspareunia is as a result of her ovaries  that were left in place.  The patient is obese and her adnexa could not  be palpated during examination.  However, she was told to come back  today to one of the surgeons to plan a bilateral salpingo-oophorectomy.   PAST MEDICAL HISTORY:  None.   PAST SURGICAL HISTORY:  Laparoscopic hysterectomy in 2006, two cesarean  sections, tubal ligation, and laparoscopic ablation of endometriosis.   PAST OB/GYN HISTORY:  The patient has two cesarean sections and one  vaginal delivery.  She has three terminations and two miscarriages.  She  had a history of an abnormal Pap several years ago, none recently.   MEDICATIONS:  None.   ALLERGIES:  No known drug allergies.   SOCIAL HISTORY:  The patient works in the [employer]  She  denies any habits.  She denies any physical or  emotional abuse.   FAMILY HISTORY:  Remarkable for heart disease, heart attack, and high  blood pressure.  No gynecologic cancers.   PHYSICAL EXAMINATION:  VITAL SIGNS:  Temperature 98.1, pulse 79, blood  pressure 112/72, respirations 20, weight 219.8 pounds, height 60.  GENERAL:  No apparent distress.  ABDOMEN:  Moderately obese, some tenderness to palpation of the lower  abdomen.  No guarding, no rebound.  PELVIC:  Deferred at this encounter, but at her prior encounter by Dr.  Perlie Gold was significant for anterior and posterior cul-de-sac tenderness  and inability to palpate the adnexa.  EXTREMITIES:  No cyanosis, clubbing, or edema.   ASSESSMENT/PLAN:  The patient is a 35 year old gravida 8, para 3-0-5-3  here for surgical workup for her chronic pelvic pain.  The patient has a  history of endometriosis and desires a bilateral salpingo-oophorectomy.  The patient was told that her pain may not be ameliorated by having this  type of surgery and also discussed the risks of surgical menopause with  the patient and she still wants to  undergo this surgery.  During her  past surgery, her operative report was remarkable for significant  adhesions of the uterus, anterior abdominal wall, and omentum, so the  patient was counseled regarding the risks of bowel or bladder injury and  also possible injury to the ureters bilaterally, which might require  further surgeries.  The patient was also told that there is a high  likelihood that her surgery will not be able to be completed through  laparoscopy and might need exploratory laparotomy, which will increase  her recovery time.  The patient is very adamant that this is what she  wants and wants to proceed with the removal of her ovaries.  She was  also told that given her age, she will have to be on some hormone  replacement therapy to help mitigate the effects of being menopausal,  which she is agreeable to.  For now, she was given a prescription  of  Percocet for p.r.n. pain and was told to expect a call from the surgical  scheduler regarding the time and date of her surgery.  The patient does  want her surgery to be done in early September if possible.           ______________________________  Jaynie Collins, MD     UA/MEDQ  D:  03/12/2009  T:  03/13/2009  Job:  [id number]

## 2011-01-12 ENCOUNTER — Other Ambulatory Visit: Payer: Self-pay | Admitting: Family Medicine

## 2011-01-12 MED ORDER — POLYETHYLENE GLYCOL 3350 17 GM/SCOOP PO POWD: 17.0000 g | Freq: Every day | ORAL | Status: AC

## 2011-01-12 MED ORDER — HYDROCODONE-ACETAMINOPHEN 5-500 MG PO TABS: 1.0000 | ORAL_TABLET | ORAL | Status: DC | PRN

## 2011-01-12 MED ORDER — RANITIDINE HCL 150 MG PO TABS: 150.0000 mg | ORAL_TABLET | Freq: Two times a day (BID) | ORAL | Status: DC

## 2011-01-12 MED ORDER — DOCUSATE SODIUM 100 MG PO CAPS: 100.0000 mg | ORAL_CAPSULE | Freq: Two times a day (BID) | ORAL | Status: DC

## 2011-01-18 ENCOUNTER — Encounter: Payer: Self-pay | Admitting: Family Medicine

## 2011-01-18 ENCOUNTER — Ambulatory Visit (INDEPENDENT_AMBULATORY_CARE_PROVIDER_SITE_OTHER): Payer: Medicaid Other | Admitting: Family Medicine

## 2011-01-18 DIAGNOSIS — R413 Other amnesia: Secondary | ICD-10-CM

## 2011-01-18 DIAGNOSIS — G932 Benign intracranial hypertension: Secondary | ICD-10-CM

## 2011-01-18 NOTE — Assessment & Plan Note (Signed)
Improved since recent return home.  WIll continue to follow on subsequent visits.

## 2011-01-18 NOTE — Assessment & Plan Note (Signed)
Recent surgery for repair craniotomy for ICH, doing better at this time. It is my opinion that Caitlin Clayton should be excused from jury duty, letter written on her behalf and given to Jillyn Hidden ( husband).  NG tube removed.  Patient is to call if having trouble feeding or swallowing, or if coughing/aspiration.

## 2011-01-18 NOTE — Progress Notes (Signed)
  Subjective:    Patient ID: Caitlin Clayton, female    DOB: 1975/12/28, 35 y.o.   MRN: [id number]  HPI Caitlin Clayton is here today with husband Caitlin Clayton, got discharged from inpatient rehab about 2 weeks ago.  Had NG tube for tube feeds upon discharge, home health nurse has been administering feeds.  NG clogged since June 2nd, [name] has been eating by mouth and enjoys Ensure shakes. Feels well overall, has appetite to eat.  No nausea or vomiting, is very bothered by NG tube and would like it removed.  No fevers or chills. Some headache but is manageable.  Has opioid pain meds prescribed by Neurosurgery at [facility].  No longer with delirium or belief that she has an infant at home.  Is clearly oriented to place and time now.  Asks for me to remove NG tube.   Got summons for [employer], would like me to write her a letter to get excused.  Review of Systems     Objective:   Physical Exam Well appearing, no apparent distress.  Sunken R hemicranium. Healed craniotomy scar.  NG Tube in place with tape.  Tube removed without difficulty.  Clear oropharynx and nasal turbinates, no bleeding or apparent trauma. Swallowing intact.  Neck supple, no adenopathy.         Assessment & Plan:

## 2011-02-11 ENCOUNTER — Other Ambulatory Visit: Payer: Self-pay | Admitting: Family Medicine

## 2011-02-11 MED ORDER — ACETAZOLAMIDE 250 MG PO TABS: 500.0000 mg | ORAL_TABLET | Freq: Two times a day (BID) | ORAL | Status: DC

## 2011-03-10 ENCOUNTER — Encounter: Payer: Self-pay | Admitting: Family Medicine

## 2011-03-14 ENCOUNTER — Telehealth: Payer: Self-pay | Admitting: *Deleted

## 2011-03-14 ENCOUNTER — Other Ambulatory Visit: Payer: Self-pay | Admitting: Family Medicine

## 2011-03-14 MED ORDER — ESTRADIOL 2 MG PO TABS: 2.0000 mg | ORAL_TABLET | Freq: Every day | ORAL | Status: DC

## 2011-03-14 NOTE — Telephone Encounter (Signed)
[facility] on [address] - needs refills for Amitriptyline, Estradiol and Mirtazapine.  Please call when they have been completed.

## 2011-03-14 NOTE — Telephone Encounter (Signed)
I had updated Caitlin Clayton's med list after recent hospitalization at [facility].  The estradiol has been resubmitted.  Amitriptyline and Mirtazapine are no longer on her med lists according to my records.   Does [facility] [address] use e-prescribing to send refill requests?   Jacquelinne Speak O

## 2011-03-14 NOTE — Telephone Encounter (Signed)
Called and spoke with Jillyn Hidden to give Dr.Breen's message. Re: meds. Lorenda Hatchet, Renato Battles

## 2011-05-17 ENCOUNTER — Ambulatory Visit (INDEPENDENT_AMBULATORY_CARE_PROVIDER_SITE_OTHER): Payer: Medicaid Other | Admitting: Family Medicine

## 2011-05-17 ENCOUNTER — Encounter: Payer: Self-pay | Admitting: Family Medicine

## 2011-05-17 VITALS — BP 122/88 | HR 86 | Wt 222.0 lb

## 2011-05-17 DIAGNOSIS — G932 Benign intracranial hypertension: Secondary | ICD-10-CM

## 2011-05-17 DIAGNOSIS — G4700 Insomnia, unspecified: Secondary | ICD-10-CM | POA: Insufficient documentation

## 2011-05-17 DIAGNOSIS — Z23 Encounter for immunization: Secondary | ICD-10-CM

## 2011-05-17 MED ORDER — NORTRIPTYLINE HCL 10 MG PO CAPS: 10.0000 mg | ORAL_CAPSULE | Freq: Every day | ORAL | Status: DC

## 2011-05-17 MED ORDER — ACETAMINOPHEN-CODEINE 300-30 MG PO TABS: 1.0000 | ORAL_TABLET | Freq: Four times a day (QID) | ORAL | Status: DC | PRN

## 2011-05-17 NOTE — Patient Instructions (Signed)
It was a pleasure to see you today.  I sent prescriptions for Nortriptyline to the [facility] on [address]; take 1 tablet by mouth at bedtime to help you sleep.  For your headache, I sent in a prescription for Tylenol #3 one tablet every 6 hours as needed for the headache.  Also at [facility] on [address].  I would like you to schedule an appointment with Dr Pascal Lux in this office; she makes her own appointments.  FOLLOW UP WITH DR Mauricio Po IN 2 TO 3 WEEKS;  CARD FOR DR Pascal Lux TO MAKE APPOINTMENT.

## 2011-05-17 NOTE — Progress Notes (Signed)
  Subjective:    Patient ID: Caitlin Clayton, female    DOB: 04-Mar-1976, 35 y.o.   MRN: [id number]  HPI Shalita requests to be seen in a separate room from her husband for today's visit.  She is distressed because her husband confessed 10 days ago to being in a relationship with another woman.  Has been sleeping at the other woman's house, leaving Signa at home with their 27 and 22 yr old children.  Blakelynn has not slept in those 10 days.  Is distraught, still processing this news.  Has felt depressed at times; reports passive death wish but states she would not harm herself, wants to be strong for her children. Prior marriage ended for ex-husband's infidelity; she wants to give a more positive model to her daughter.  Says at this time she would plan to divorce once her financial situation improves.  Reports having suicidal thoughts at age 52, having cutting behaviours at that time.  Unsure of which antidepressants she has been on.  Has been on amitriptyline for sleep in the past, is interested in something to help her sleep. Is amenable to seeing psychologist for help in dealing with marital situation.  Also reports continued headaches, albeit less intense than before.  Generalized. 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  Continues to be seen periodically at [facility], most recently seen by Dr. Lilian Kapur there.  Repeat follow up in 2013.   Headaches rates around 6/10; in the past, T#3 has helped.    Review of Systems No SI or HI.      Objective:   Physical Exam Alert, oriented, no apparent distress. Becomes tearful in discussion of marital infidelity.  Normal speech tone and cadence. HEENT Neck supple.         Assessment & Plan:

## 2011-05-17 NOTE — Assessment & Plan Note (Addendum)
Patient with situational depression and insomnia.  No suspicion of mania or hypomania in current circumstances.  She is amenable to seeing our psychologist, Dr. Spero Geralds.  Will ask her to call Dr Pascal Lux for appointment.  To consider/discuss antidepressant medication; she has been on meds in the past but cannot recall names/effects.  THis would be helpful before embarking on medication therapy.  But for her uninsured status, might consider SNRI for analgesic effects in addition to antidepressant effects.   Greater than 45 minutes face-to-face time spent with patient.

## 2011-05-17 NOTE — Assessment & Plan Note (Signed)
Long history of headaches associated with this diagnosis. Formerly on opioids, which neurology had withheld during her surgery and recovery from subdural due to risk of sedation.  At present, she continues with headache.  Agrees to use of T#3 only in cases of severe headache.  Will readdress in 2-3 weeks at follow up. SUspect emotional state and sleep deprivation are lowering her pain threshold.

## 2011-06-03 ENCOUNTER — Ambulatory Visit (INDEPENDENT_AMBULATORY_CARE_PROVIDER_SITE_OTHER): Payer: Medicaid Other | Admitting: Family Medicine

## 2011-06-03 ENCOUNTER — Encounter: Payer: Self-pay | Admitting: Family Medicine

## 2011-06-03 DIAGNOSIS — F329 Major depressive disorder, single episode, unspecified: Secondary | ICD-10-CM | POA: Insufficient documentation

## 2011-06-03 MED ORDER — DULOXETINE HCL 30 MG PO CPEP: ORAL_CAPSULE | ORAL | Status: DC

## 2011-06-03 NOTE — Patient Instructions (Signed)
It was a pleasure to see you today and to meet your sister [name].    I am starting you on a new medication, Cymbalta 30mg  capsules.  Begin by taking 1 capsule daily for 2 weeks, then increase to 2 capsules one time daily (60mg  daily).    I would like to see you back in 4 weeks.  In the interim, I want you to call Dr. Spero Geralds, PsyD, here in the [facility], to make an appointment to see her.   As we discussed, I see no medical reason why you cannot drive to [place] to visit your family.  I believe it will be a good change of pace to visit your family.

## 2011-06-03 NOTE — Progress Notes (Signed)
  Subjective:    Patient ID: Caitlin Clayton, female    DOB: [birth year], 35 y.o.   MRN: [id number]  HPI Janaiya comes in today with her sister [name] for follow up of feelings of depression, and her headaches.  She feels emotionally a bit stronger than at our last visit, when she had just received news from her husband of his affair.  Since then he no longer sleeps in the house; he keeps his clothes at home and comes back each morning to get dressed, pick up their two kids and take them to school.  Azaliah plans to file for divorce.  Says she suspected that something was going on, had similar experience in her prior marriage that ended in divorce.  Children are acting a bit more edgy and 'disrespectful' toward Ashley, according to sister [name].  Glorian denies any suicidal thoughts or actions but has a prior history of cutting.  Unsure of antidepressant therapy she may have used in the past.  Continues with some headaches and poor sleep.  Daily headaches in the evening; takes Advil PM and this helps a little. The Tylenol #3 that I prescribed last visit did not help.   She is seeing her neurosurgeon every 6 months for follow up from her subdural hematoma and shunting for her pseudotumor cerebri.  Last seen in June with plans for a 6 month follow up.  Review of Systems see HPI     Objective:   Physical Exam Alert, responds appropriately.  Ambulates without assistance.   PMH( done today: 15 (3 pts for Q#2,3,6,7; 1 point Q#1,8.  Zero points Q#9)."somewhat difficult"      Assessment & Plan:

## 2011-06-03 NOTE — Assessment & Plan Note (Signed)
Plan to begin SNRI with cymbalta, both for depression symptoms and for help with pain complaints that have had Caitlin Clayton on chronic opioids in the past.  She has suffered from headaches for many years, have been attributed to her pseudotumor cerebri in the past.  She is eager to see psychologist in conjunction with medication therapy.  Sister is supportive of this, is her primary caregiver and lives in her home as well.  I have asked her to call Dr Pascal Lux to schedule her appointment. Follow up with me in 4 weeks (presumably after having seen Dr. Pascal Lux).

## 2011-06-06 ENCOUNTER — Ambulatory Visit (HOSPITAL_COMMUNITY)
Admission: RE | Admit: 2011-06-06 | Discharge: 2011-06-06 | Disposition: A | Discharge status: Home / Self Care | Payer: Medicaid Other | Source: Ambulatory Visit | Attending: Family Medicine | Admitting: Family Medicine

## 2011-06-06 ENCOUNTER — Other Ambulatory Visit: Payer: Self-pay

## 2011-06-06 ENCOUNTER — Other Ambulatory Visit: Payer: Self-pay | Admitting: Family Medicine

## 2011-06-06 ENCOUNTER — Telehealth: Payer: Self-pay | Admitting: Family Medicine

## 2011-06-06 ENCOUNTER — Ambulatory Visit (INDEPENDENT_AMBULATORY_CARE_PROVIDER_SITE_OTHER): Payer: Medicaid Other | Admitting: *Deleted

## 2011-06-06 VITALS — BP 120/80 | HR 96

## 2011-06-06 DIAGNOSIS — T50901A Poisoning by unspecified drugs, medicaments and biological substances, accidental (unintentional), initial encounter: Secondary | ICD-10-CM

## 2011-06-06 DIAGNOSIS — T65891A Toxic effect of other specified substances, accidental (unintentional), initial encounter: Secondary | ICD-10-CM | POA: Insufficient documentation

## 2011-06-06 DIAGNOSIS — T6591XA Toxic effect of unspecified substance, accidental (unintentional), initial encounter: Secondary | ICD-10-CM | POA: Insufficient documentation

## 2011-06-06 LAB — COMPREHENSIVE METABOLIC PANEL
AST: 12 U/L (ref 0–37)
Albumin: 3.8 g/dL (ref 3.5–5.2)
Alkaline Phosphatase: 57 U/L (ref 39–117)
Glucose, Bld: 81 mg/dL (ref 70–99)
Potassium: 4.9 mEq/L (ref 3.5–5.3)
Sodium: 141 mEq/L (ref 135–145)
Total Bilirubin: 0.2 mg/dL — ABNORMAL LOW (ref 0.3–1.2)
Total Protein: 7 g/dL (ref 6.0–8.3)

## 2011-06-06 MED ORDER — DULOXETINE HCL 30 MG PO CPEP: ORAL_CAPSULE | ORAL | Status: DC

## 2011-06-06 NOTE — Telephone Encounter (Signed)
Called to report the lab results to [facility].  To resume Cymbalta 30mg  at night for 2 weeks, then 60mg  daily. [name] is custodian of the medication.  Abdoulaye Drum O

## 2011-06-06 NOTE — Progress Notes (Signed)
  Subjective:    Patient ID: Normajean Glasgow, female    DOB: 1976-07-23, 35 y.o.   MRN: [id number]  HPI Patient is seen today with sister [name], for same-day evaluation of possible accidental overdose of duloxetine. Please see phone note from today as well for additional clinical details. In summary [name] called this morning to report that approximately 30 capsules of duloxetine 30 mg we're missing from the pill bottle as of yesterday afternoon. 60 capsules were dispensed from the pharmacy on the date of the prescription which was October 19. The patient is unaware of taking additional capsules, although her memory has been some what questionable since her neurosurgery. She is adamantly denying any intentional overdose. She also is adamant in denying any Tylenol ingestion or other medication ingestions other than her prescribed meds.  Today in the office she is alert and oriented and reports feeling well.   Review of Systems     Objective:   Physical Exam Gen.: Alert well interactive and able to give a detailed history. No apparent distress.  HEENT neck is supple.  Vital signs: Blood pressure 120/80 pulse 96 and regular.  EKG: Normal sinus rhythm without ventricular rate of 92 beats per minute no QT prolongation and no signs of ischemia.       Assessment & Plan:

## 2011-06-06 NOTE — Telephone Encounter (Signed)
Pt was here on Friday and was given rx for 60 tabs of Cymbalta.  Caregiver (sister) left the house on Saturday and told pt to only take 1 pill.  When she went to get the bottle yesterday, there was only 25 left in it.  Caitlin Clayton told her that she had been taking them for a headache. Needs to know what to do.

## 2011-06-06 NOTE — Telephone Encounter (Signed)
Spoke with sister. States she gave her one cymbalta on Friday . Sister was out of the house on Sat. and when she returned she was looking for the cymbalta bottle and was unable to find. Patient admitted to taking two cymbalta for  headache. Sister found the bottle yesterday and there were only 25 tabs left . I spoke with patient and she states she took 2 on Sat and 2 on Sunday . Sister states she did not give her any cymbalta yesterday. They went to grandmother's 90th birthday party yesterday and patient seemed down , not up dancing as she usually would.  Today is awake and alert but more sluggish than usual , still lying in bed . Generally would be up by now.   Dr. Mauricio Po notified . States he will call and speak with patient later .   Dr. Mauricio Po spoke with sister . She will come to office now.

## 2011-06-06 NOTE — Telephone Encounter (Signed)
Called to speak with patient and sister, Caitlin Clayton [phone] to discuss question of accidental overdose of Duloxetine.  Was dispensed #60 capsules of Cymbalta 30mg  on 10/19, took one capsule in the evening on Friday.  Unsupervised on Saturday 10/20, reportedly to "two capsules" for her headache.  On Sunday, 10/22, sister Caitlin Clayton reports that the pill bottle was found to be missing roughly #30 capsules (900mg ) of the medicine.  When questioned, Caitlin Clayton denied taking more than the 2 capsules.  No tylenol in the house per [name].  I called Poison Control [phone], [name]).  Discussed risk of Serotonin Syndrome and somnolence, syncope, vomiting, also danger of overdose with other hepatically metabolized medications such as Tylenol.  Will have Sheng come in now for CMet, acetaminophen level, and checking of vital signs.  Labs to be ordered stat.

## 2011-06-10 ENCOUNTER — Telehealth: Payer: Self-pay | Admitting: *Deleted

## 2011-06-10 NOTE — Telephone Encounter (Signed)
Pt's Sister is wanting to know if pt can take z quil due to not being able to sleep? Pt is on cymbalta and is there going to be any kind of adverse side effect from this.Loralee Pacas [place]

## 2011-06-12 NOTE — Telephone Encounter (Signed)
I am not sure what is the active ingredient in Zquil; I would recommend not taking it while starting Cymbalta. Caitlin Clayton O

## 2011-06-13 ENCOUNTER — Telehealth: Payer: Self-pay | Admitting: Family Medicine

## 2011-06-13 NOTE — Telephone Encounter (Signed)
Pt's sister called back and I advised her of Dr Marinell Blight recommendations. She understood.

## 2011-06-13 NOTE — Telephone Encounter (Signed)
LVM for pt to rt call.

## 2011-06-13 NOTE — Telephone Encounter (Signed)
It looks like this medication was discontinued according to the last office note will forward to Dr Mauricio Po to verify.Caitlin Clayton, Rodena Medin

## 2011-06-13 NOTE — Telephone Encounter (Signed)
Sister has a question about whether Glena should be take Nortritcyline.

## 2011-06-15 NOTE — Telephone Encounter (Signed)
Thank you. Caitlin Clayton

## 2011-06-15 NOTE — Telephone Encounter (Signed)
Yes, the nortriptyline was discontinued.  Please let [name] (sister) know.  We discussed during visit, but we covered a lot of changes then. Thanks, Barbaraann Barthel

## 2011-06-15 NOTE — Telephone Encounter (Signed)
Called pt's sister and informed. She reports, that [facility] refilled the Nortriptyline. She will let [facility] know. Lorenda Hatchet, Renato Battles

## 2011-07-06 ENCOUNTER — Encounter: Payer: Self-pay | Admitting: Family Medicine

## 2011-07-06 ENCOUNTER — Ambulatory Visit (INDEPENDENT_AMBULATORY_CARE_PROVIDER_SITE_OTHER): Payer: Medicaid Other | Admitting: Family Medicine

## 2011-07-06 DIAGNOSIS — G4700 Insomnia, unspecified: Secondary | ICD-10-CM

## 2011-07-06 DIAGNOSIS — F329 Major depressive disorder, single episode, unspecified: Secondary | ICD-10-CM

## 2011-07-06 MED ORDER — AMITRIPTYLINE HCL 25 MG PO TABS: 25.0000 mg | ORAL_TABLET | Freq: Every day | ORAL | Status: DC

## 2011-07-06 NOTE — Assessment & Plan Note (Signed)
PHQ9 done today with score of 7 (zero pts Q#9; 3 pts #3, 1 pt each Q#1,2,4,8). Continue Cymbalta 60mg  nightly.

## 2011-07-06 NOTE — Progress Notes (Signed)
  Subjective:    Patient ID: Caitlin Clayton, female    DOB: [birth year], 35 y.o.   MRN: [id number]  HPI Seen today in followup.  Sister [name] is here as well, taking care of many things in the household.  Since last visit, husband Jillyn Hidden is no longer on the house title, and has moved out.  Changing the locks soon.  Feels safe.  Continues with HAs and lack of sleep since last visit.  Not taking anything for pain or sleep.  T#3 did not help at all.  Sister gave some her own Percocet, which helped a little. No emesis with HA; some photophobia associated.    Review of Systems PHQ9 completed today; No SI.    Objective:   Physical Exam Alert, animated in our conversation, appears comfortable. Walking without assist or walker.   HEENT Neck supple. PERRL.  No cervical adenopathy. No frontal or maxillary sinus tenderness.        Assessment & Plan:

## 2011-07-06 NOTE — Assessment & Plan Note (Signed)
Difficulty with sleep, which worsens her chronic headaches.  T#3 has not helped the headaches.  In the past she had done well with low doses of Elavil for sleep; will try again now at bedtime every night. She is following with Dr Kelby Aline on December 21st.

## 2011-07-06 NOTE — Patient Instructions (Signed)
It was a pleasure to see you today.  I am starting you on Amitriptyline 25mg  one time daily, (at bedtime) for sleep, which I believe will help with your headaches as well.    I will forward today's visit note to Dr. Kelby Aline at [facility] in anticipation for your visit there on December 21st.   I would like to see you back in about 4 to6 weeks (after your visit at Dr. Wayne Sever office).

## 2011-08-24 ENCOUNTER — Ambulatory Visit: Payer: Medicaid Other | Admitting: Family Medicine

## 2011-08-24 ENCOUNTER — Encounter: Payer: Self-pay | Admitting: Family Medicine

## 2011-08-24 ENCOUNTER — Ambulatory Visit (INDEPENDENT_AMBULATORY_CARE_PROVIDER_SITE_OTHER): Payer: Medicaid Other | Admitting: Family Medicine

## 2011-08-24 VITALS — BP 144/85 | HR 86 | Ht 60.0 in | Wt 239.0 lb

## 2011-08-24 DIAGNOSIS — F329 Major depressive disorder, single episode, unspecified: Secondary | ICD-10-CM

## 2011-08-24 MED ORDER — AMITRIPTYLINE HCL 50 MG PO TABS: 50.0000 mg | ORAL_TABLET | Freq: Every day | ORAL | Status: DC

## 2011-08-24 MED ORDER — OXYCODONE-ACETAMINOPHEN 5-325 MG PO TABS: 1.0000 | ORAL_TABLET | Freq: Two times a day (BID) | ORAL | Status: AC | PRN

## 2011-08-24 NOTE — Patient Instructions (Addendum)
It was a pleasure to see you today.  The following is a summary of what we talked about:  1. Headaches, poor sleep.  I would like to increase the dose of your Amitriptyline from 25mg  to 50mg  at bedtime.  I believe that improved sleep will help with your headaches.   2.  As we discussed, regular use of the Percocet will lead to your body becoming addicted to the medication.  I do not believe this is a good long-term treatment option.  I am giving you a limited supply of Percocet, to be managed by [facility], for excruciating pain only.  I am hopeful that treatment of depression and poor sleep will lead to improvements in your headaches.  It may not be reasonable to expect to become entirely pain-free.  3. I would like to taper you off the Cymbalta.  You are taking 30mg  tablets, two a day now.  I would like you to reduce to 1 tablet daily for 2 weeks, then stop.   4. I am giving you contact information for the [facility], for help in managing your depression.  Your depression is complicated by your medical conditions and I would like their assistance in evaluating this.    [facility]: How to Reach [place] For most services, the first point of entry to [facility] is through [employer], a 24-hour, seven day a week information and referral resource. Simply call [phone] any time to learn more about our services, make an appointment or learn about appropriate services in your area.  Locations and direct phone numbers are: Mental Health Treatment Services Operated by [facility] [address] [place], [place] [address] [phone] view map Crisis/Emergency Services Open 24 Hours/ 7 Days a Week Operated by [employer] [address] [place], [place] [address] [phone] view map Mental Health Treatment Services and Crisis/Emergency Services Operated by [facility] [address] [place], [place] [address] [phone] view map Weekend and Night Emergency  Services [facility] [phone] [address] [place], [place] [address] view map

## 2011-08-25 NOTE — Assessment & Plan Note (Addendum)
Continues with poor sleep, headaches which have not responded to Cymbalta nor tylenol #3.  Unable to take Tramadol due to hives.  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  I would like to get her involved in Pain Clinic or Headache Center for long-term management of this chronic problem, which in the past has been thought to derive from her Pseudotumor diagnosis.  In the meantime will give limited supply of Percocet, strictly for excruciating pain.  Will see her back in 1 month to see how she is doing.  Will increase dose of Elavil to 50mg  nightly to improve sleep and pain control (this has worked well at lower doses in the past). Consider referral to Headache Center and/or Pain Clinic at follow up in 1 month.  [facility] referral for help in managing depression; will taper off Cymbalta today. I am not starting a new antidepressant at this time.

## 2011-08-25 NOTE — Progress Notes (Signed)
  Subjective:    Patient ID: Caitlin Clayton, female    DOB: 1975-10-12, 36 y.o.   MRN: [id number]  HPI Caitlin Clayton comes in today with her sister Caitlin Clayton for follow up.  Was seen by neurosurgery Dr. Kelby Aline at [facility] recently, had CT head and told healing nicely.  Dr. Kelby Aline advised against Cymbalta because of possible memory impairment associated.  Caitlin Clayton (and Caitlin Clayton) have noticed no improvement in depressive symptoms or pain control, no improvement in sleep since initiating Cymbalta.  Denies SI.  Headaches have been a particular problem for [name]  She has taken her sister's percocet 5/325 one tab twice daily for the headaches, this is the only thing that has brought her relief.  Had hives with tramadol; T3 did not help.  In the past she had been on percocet for HA and back/leg pain when I first met her; she came off those meds during the course of her more acute medical events/hospitalizations.    Increased stressors since filing for divorce from husband Caitlin Clayton.  Sister recently got a job and is helping to support [name]    Review of Systems Continues with headaches; poor sleep.      Objective:   Physical Exam  Alert, somewhat emotionally labile when talking about her sister "my guardian angel" and the pain she has with HAs.  Neck supple.  Gait unremarkable, independent.      Assessment & Plan:

## 2011-09-02 ENCOUNTER — Ambulatory Visit: Payer: Medicaid Other | Admitting: Family Medicine

## 2011-09-12 ENCOUNTER — Other Ambulatory Visit: Payer: Self-pay | Admitting: Family Medicine

## 2011-09-12 MED ORDER — ESTRADIOL 2 MG PO TABS: 2.0000 mg | ORAL_TABLET | Freq: Every day | ORAL | Status: DC

## 2011-09-12 NOTE — Telephone Encounter (Signed)
Rx sent in.  Please call to notify.  Thanks,.  JB

## 2011-09-12 NOTE — Telephone Encounter (Signed)
Patients sister is calling because the refills are out on her hormone.  Patient has an appt on 2/12.  They use [facility] on [address].  Please call them when it is ready.

## 2011-09-12 NOTE — Telephone Encounter (Signed)
Forward to Dr Breen 

## 2011-09-14 NOTE — Telephone Encounter (Signed)
Advised pt's sister of med sent to pharmacy.

## 2011-09-27 ENCOUNTER — Ambulatory Visit (INDEPENDENT_AMBULATORY_CARE_PROVIDER_SITE_OTHER): Payer: Medicaid Other | Admitting: Family Medicine

## 2011-09-27 ENCOUNTER — Encounter: Payer: Self-pay | Admitting: Family Medicine

## 2011-09-27 DIAGNOSIS — G4700 Insomnia, unspecified: Secondary | ICD-10-CM

## 2011-09-27 MED ORDER — NAPROXEN 500 MG PO TABS: 500.0000 mg | ORAL_TABLET | Freq: Two times a day (BID) | ORAL | Status: DC

## 2011-09-27 MED ORDER — ACETAMINOPHEN 500 MG PO CAPS: 1.0000 | ORAL_CAPSULE | ORAL | Status: AC | PRN

## 2011-09-27 NOTE — Patient Instructions (Signed)
It was a pleasure to see you today. I am glad your sleep is coming along, and this is helping with your headaches.   If your headache pain comes back, I prefer you start with the Tylenol 500mg , one tab every 4 hours as needed.  Then to Naproxen 500mg  every 12 hours as needed with something to eat.  If neither of these help, please let me know.   I would like to see you back in 4 to 6 months or sooner if needed.

## 2011-09-28 NOTE — Assessment & Plan Note (Signed)
Both the sleep issues and the headaches have improved markedly since last visit, mostly owing to the increase in the Elavil dose.  Does not oversedate her, allows her to function during the day.  Regarding the HAs, we discussed the plan to use Tylenol and/or Naproxen as needed, before considering Percocet.  I reiterated to [facility] and her sister that I believe the Percocet should not be a chronic medication, and that even using it intermittently can lead to the 'slippery slope' of more frequent (and ultimately daily) use.  She is to contact me if this seems to be the trend.   Plan for follow up appointment in about 4 months, or sooner as needed.

## 2011-09-28 NOTE — Progress Notes (Signed)
  Subjective:    Patient ID: Normajean Glasgow, female    DOB: Oct 06, 1975, 36 y.o.   MRN: [id number]  HPI Ryn comes in today for follow up accompanied by her sister, [name];  They both report that Kaitlan's headaches have gotten considerably better with the increased dose of Elavil at bedtime.  Sleeping much better, soundly, no snoring. Goes to bed at 9-10pm, awakens at 8am and feels rested.  Reduced evening water intake to avoid nocturia, this has worked. Better mood overall.  Is going through divorce proceedings and vows "not to let it get to me".  Has strong family support, mostly from her sister. Has resorted to Percocet 1 tablet on occasion for headaches, perhaps about 3 tablets/week on average.  More likely to do this in the evening time when the HA is more prominent.    Review of Systems See HPI     Objective:   Physical Exam Well appearing, no apparent distress.  HEENT neck supple. No tenderness to palpate over frontal or maxillary sinuses.  No cervical adenopathy       Assessment & Plan:

## 2011-12-09 ENCOUNTER — Ambulatory Visit: Payer: Medicaid Other | Admitting: Family Medicine

## 2011-12-09 ENCOUNTER — Ambulatory Visit (INDEPENDENT_AMBULATORY_CARE_PROVIDER_SITE_OTHER): Payer: Medicaid Other | Admitting: Family Medicine

## 2011-12-09 ENCOUNTER — Encounter: Payer: Self-pay | Admitting: Family Medicine

## 2011-12-09 VITALS — BP 118/87 | HR 102 | Ht 60.0 in | Wt 236.0 lb

## 2011-12-09 DIAGNOSIS — G932 Benign intracranial hypertension: Secondary | ICD-10-CM

## 2011-12-09 MED ORDER — AMITRIPTYLINE HCL 50 MG PO TABS: 75.0000 mg | ORAL_TABLET | Freq: Every day | ORAL | Status: DC

## 2011-12-09 MED ORDER — INDOMETHACIN 50 MG PO CAPS: 50.0000 mg | ORAL_CAPSULE | Freq: Every day | ORAL | Status: DC

## 2011-12-09 NOTE — Patient Instructions (Signed)
We are going to get a head CT to check that nothing is going on with the shunt.   I am also going to prescribe indomethacin, which is a strong NSAID to be taken with food. It can be hard on the stomach, so taking it with food is very important.   Please call the neurosurgeon to make a follow up appointment since you are having increased headaches.   I am also increasing the amytriptiline to 75mg  daily.

## 2011-12-10 NOTE — Progress Notes (Signed)
Patient ID: Caitlin Clayton, female   DOB: [birth year], 36 y.o.   MRN: [id number] Patient ID: Caitlin Clayton    DOB: 27-Jul-1976, 36 y.o.   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 History was obtained from patient and her sister (via phone). Headache is bi-temporal, pounding in nature and intermittent. Her sister describes that patient does ok during the day, but starts having increased headache in the afternoon around 4-5pm which lasts throughout the night. Pain is keeping patient from sleeping. Headache is associated with photophobia and phonophobia. She denies any nausea, vomiting, change in vision, fever or nuchal rigidity. Denies any focal weakness, loss of sensation, slurred speech. Per her sister, her functional level has been the same.  She has only been taking the amytriptiline 50mg , which they moved to earlier in the day because she was having more pain. She has not been taking any other analgesic medicines, since tylenol, naproxen and ibuprofen have not worked for her pain in the past.   PMHx: reviewed allergies and medications with patient Soc: denies tobacco use ROS: see HPI Objective: Filed Vitals:   12/09/11 1430  BP: 118/87  Pulse: 102    Physical Examination:   General appearance - alert, well appearing, and in no distress Mouth - mucous membranes moist, pharynx normal without lesions Eye - PERRLA, EOMI, fundal exam: no blurred disk margin appreciated Neuro - CN2-12 grossly intact, 5/5 strength in upper and lower extremities b/l, diminished reflexes but equal bilaterally,  Neck - supple, no rigidity, normal range of motion Chest - clear to auscultation, no wheezes, rales or rhonchi, symmetric air entry Heart - normal rate, regular rhythm, normal S1, S2, no murmurs, rubs, clicks or gallops

## 2011-12-10 NOTE — Assessment & Plan Note (Addendum)
With h/o intracranial shunt, there is concern for shunt malfunction with increasing headaches. Shunt infection was also part of the differential, but much less likely given the absence of fever, absence of meningeal signs and overall well appearing patient. Since patient had not been taking any analgesics for headache, it was also less likely to be rebound headache. Prescribed indomethacin 50mg  daily with food for abortive therapy and also increased amitryptiline dose to 75mg  daily from 50mg . Also will obtain head CT early next week to assess for any increase in intracranial pressure, although normal fundoscopic exam and stable vitals (assessing for Cushing's triad) are reassuring in that regard. Also recommended that patient see her neurosurgeon sooner since she is having these worsening headaches as well as for her to follow up with Dr. Mauricio Po, her PCP.  Treatment plan was discussed with Dr. McDiarmid.

## 2011-12-13 ENCOUNTER — Other Ambulatory Visit: Payer: Medicaid Other

## 2011-12-20 ENCOUNTER — Ambulatory Visit
Admission: RE | Admit: 2011-12-20 | Discharge: 2011-12-20 | Disposition: A | Discharge status: Home / Self Care | Payer: Medicaid Other | Source: Ambulatory Visit | Attending: Family Medicine | Admitting: Family Medicine

## 2011-12-20 ENCOUNTER — Telehealth: Payer: Self-pay | Admitting: *Deleted

## 2011-12-20 DIAGNOSIS — G932 Benign intracranial hypertension: Secondary | ICD-10-CM

## 2011-12-20 NOTE — Telephone Encounter (Signed)
Patient had CT head and calling to give results---right craniotomy performed since previous brain MRI.  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  More age indeterminates.  New changes in right ACA territory suspicious for subacute infarct.  Follow-up brain MRI would better characterize the interval changes.  Copy of report faxed to our office and placed in Dr. Marinell Blight box.  Will route note to Dr. Mauricio Po and Dr. Gwenlyn Saran.  Gaylene Brooks, RN

## 2011-12-21 ENCOUNTER — Telehealth: Payer: Self-pay | Admitting: Family Medicine

## 2011-12-21 NOTE — Telephone Encounter (Signed)
Called patient and left voice message for her to call back.  I wish to relay the results of her CT brain done 12/20/11, which does not show intracranial bleeding or mass effect.  I also wish to know how she is feeling since her most recent visit, and whether she has made an appointment to see her neurosurgeon at [facility]. Paula Compton, MD

## 2011-12-21 NOTE — Telephone Encounter (Signed)
Received call back from patient's sister [name], who reports that patient is still having headaches that wake her up at 2am.  Increased Elavil dosing not helping.  Reviewed CT results, which do not show evidence of mass effect or active intracranial bleeding.  Has scheduled Neurosurg appointment in December, she is instructed to call and hasten appointment in light of new onset (2 week) headache, in patient with shunt.  She has appt with me this Friday May 10th, for follow up.   May take Indomethacin before then to see if helps with pain.  Paula Compton, MD

## 2011-12-23 ENCOUNTER — Ambulatory Visit (INDEPENDENT_AMBULATORY_CARE_PROVIDER_SITE_OTHER): Payer: Medicaid Other | Admitting: Family Medicine

## 2011-12-23 ENCOUNTER — Encounter: Payer: Self-pay | Admitting: Family Medicine

## 2011-12-23 VITALS — BP 140/94 | HR 92 | Ht 60.0 in | Wt 238.0 lb

## 2011-12-23 DIAGNOSIS — G932 Benign intracranial hypertension: Secondary | ICD-10-CM

## 2011-12-23 MED ORDER — GABAPENTIN 300 MG PO CAPS: 300.0000 mg | ORAL_CAPSULE | Freq: Three times a day (TID) | ORAL | Status: DC

## 2011-12-23 NOTE — Progress Notes (Signed)
  Subjective:    Patient ID: Caitlin Clayton, female    DOB: 1976-05-12, 36 y.o.   MRN: [id number]  HPI Caitlin Clayton is seen today for follow up of headaches.  She is accompanied by her father and his girlfriend; additionally, sister Caitlin Clayton (patient's primary caregiver and managing meds) was consulted during the visit by telephone.   Caitlin Clayton reports that she is feeling better; headaches have become a problem again in the past 2 to 3 weeks.  Per note from [place], restarted on 12/05/2011.  Caitlin Clayton confirms that HAs start "just before I am going to go to bed".  Keep her awake for several hours.  Initially some confusion about when she takes the elavil, but Caitlin Clayton was able to confirm that she takes it at bedtime. No nausea, no emesis, no photophobia.    Recap of Caitlin Clayton' history, per Caitlin Clayton's note: "First surgery 08/02/2010, released 10/01/2010.  Caitlin Clayton's second surgery 12/09/2010, released 01/06/2011.  Caitlin Clayton's last appointment with neuro (neurosurgery): 08/05/11; Next appointment with neuro 07/26/12.  At last visit was prescribed indomethacin for headache pain.  Caitlin Clayton and Caitlin Clayton both express their concern for possible side effects as a reason why she has not tried this medication.    Review of SystemsSee HPI     Objective:   Physical Exam Well appearing, no apparent distress HEENT Neck supple. nondilated funduscopic exam: crisp discs without evidence of papilledema bilaterally.  Neck supple. PERRL. No cervical adenopathy. Cervical ROM (active) full in all planes.        Assessment & Plan:

## 2011-12-23 NOTE — Assessment & Plan Note (Signed)
Here today for follow up on headaches, which have recurred in the past 2-3 weeks.  She has had VP shunting and complicated postoperative history.  Her HAs have been occurring about every other night since late April.  Lack of fevers/photophobia, nuchal rigidity, or papilledema on exam are reassuring.  Discussed and reviewed results of recent CT head ordered at last visit, which did not show mass effect or acute bleeding.  Comparison films from before her surgeries in 2011/2012. Will add gabapentin to her medication regimen; explained why I am reluctant to begin treatment with narcotics for this HA pain without clear endpoint.  (Caitlin Clayton had asked about this in handwritten note presented during the visit).  Will titrate up on gabapentin and see if this helps.  In light of her history of shunt, I reviewed signs/symptoms for which she needs immediate attention.  Also recommended hastening followup appointment with her neurosurgery team in [facility] if not improving with addition of neurontin to her amitriptyline.  Caitlin Clayton gives verbal authorization for me to speak by phone with patient's father and his girlfriend about her condition, as well as with [name] (sister and caregiver).

## 2011-12-23 NOTE — Patient Instructions (Addendum)
It was a pleasure to see you today; I would like you (or family member) to call with the name and dosage of the medicine that you are taking at bedtime now.   Begin to take the amitriptyline (Elavil) 50mg  tablets, 1-1/2 tablet AT BEDTIME, ONE to 1-1/2 HOURS BEFORE YOU GO TO BED.  Please take gabapentin 300mg  capsules, one time daily in the morning for one week, then morning and noon for a week, and finally three times daily.  This is for the headaches.  I encourage you to begin a walking regimen, starting slowly and increasing as your tolerance improves.   FOLLOW UP IN 6 TO 8 WEEKS

## 2012-02-07 ENCOUNTER — Encounter: Payer: Self-pay | Admitting: Family Medicine

## 2012-02-07 ENCOUNTER — Ambulatory Visit (INDEPENDENT_AMBULATORY_CARE_PROVIDER_SITE_OTHER): Payer: Medicaid Other | Admitting: Family Medicine

## 2012-02-07 VITALS — BP 111/79 | HR 84 | Ht 60.0 in | Wt 235.6 lb

## 2012-02-07 DIAGNOSIS — G932 Benign intracranial hypertension: Secondary | ICD-10-CM

## 2012-02-07 DIAGNOSIS — G4700 Insomnia, unspecified: Secondary | ICD-10-CM

## 2012-02-07 MED ORDER — AMITRIPTYLINE HCL 75 MG PO TABS: 75.0000 mg | ORAL_TABLET | Freq: Every day | ORAL | Status: DC

## 2012-02-07 MED ORDER — ESTRADIOL 2 MG PO TABS: 2.0000 mg | ORAL_TABLET | Freq: Every day | ORAL | Status: DC

## 2012-02-07 NOTE — Patient Instructions (Addendum)
It was a pleasure to see you today.  I am so glad your headaches are better.   I sent in refills of all your prescribed medications; please remember to ask Dr Kelby Aline about the aspirin dose when you go for your next neurosurgery appointment.   Please call if you need an appointment before our scheduled appointment in December/January.

## 2012-02-08 NOTE — Assessment & Plan Note (Signed)
Had been followed for recurrent headaches without fevers or motor impairment.  These have nearly resolved with addition of low-dose gabapentin, improvement in sleep hygiene.  Discussed s/sx that should prompt Caitlin Clayton to seek immediate emergency care, such as fevers/chills, intractable headache, intractable nausea/vomiting, motor symptoms or incontinence. She and Caitlin Clayton voice understanding of this.

## 2012-02-08 NOTE — Progress Notes (Signed)
  Subjective:    Patient ID: Caitlin Clayton, female    DOB: 1975/10/15, 36 y.o.   MRN: [id number]  HPI Patient comes in with her sister Caitlin Clayton (primary caregiver) for follow-up of recurrent headaches.  She and Caitlin Clayton both report that since the increase in amitriptyline dosing from 50mg  to 75mg /noc, and addition of gabapentin, she has had marked reduction in headache intensity and frequency.  Sleep has improved dramatically as well.  Biggest complaint has to do with effort to split Elavil pills to attain dose of 75mg  (taking 1-1/2 tabs of 50mg ).  No photophobia, no fevers, no urinary/bowel incontinence, no motor weakness.   Caitlin Clayton is performing all her own ADLs at home, including bathing, toileting, dressing, preparing food/cooking.  Some of her own children's care as well.  Formalized her divorce from Caitlin Clayton, who is reported to have already gotten remarried.  He and Caitlin Clayton have joint custody of the children, they spend some weekends at his new house.   Caitlin Clayton would like a tattoo of "Caitlin Clayton" covered over (on her R upper arm/deltoid).  Asks if it is medically permissible.  I counseled on the general infectious risks of any tattoo; no specific counseling to [facility] medical situation.   Review of Systems See HPI    Objective:   Physical Exam Alert, appropriate responses to all questions.  Bright affect, engaging and animated.  PERRL, EOMI.  Clear conjunctivae. Full ROM cervical spine in all planes; no cervical adenopathy.        Assessment & Plan:

## 2012-02-08 NOTE — Assessment & Plan Note (Signed)
Marked improvement since increase in amitriptyline dosing.  She does not feel sleepy during the day, no naps.  Adhering to good sleep hygiene. Reordered Elavil with 75mg  tablets to avoid need for pill splitting. Follow up in 4 to 6 months or sooner if needed.

## 2012-02-10 ENCOUNTER — Ambulatory Visit: Payer: Medicaid Other | Admitting: Family Medicine

## 2012-03-20 ENCOUNTER — Encounter: Payer: Self-pay | Admitting: Family Medicine

## 2012-03-20 ENCOUNTER — Ambulatory Visit (INDEPENDENT_AMBULATORY_CARE_PROVIDER_SITE_OTHER): Payer: Medicaid Other | Admitting: Family Medicine

## 2012-03-20 VITALS — BP 123/85 | HR 77 | Ht 60.0 in | Wt 229.9 lb

## 2012-03-20 DIAGNOSIS — H4710 Unspecified papilledema: Secondary | ICD-10-CM

## 2012-03-20 DIAGNOSIS — H547 Unspecified visual loss: Secondary | ICD-10-CM

## 2012-03-20 DIAGNOSIS — H539 Unspecified visual disturbance: Secondary | ICD-10-CM

## 2012-03-20 NOTE — Progress Notes (Signed)
Subjective:     Patient ID: Caitlin Clayton, female   DOB: [birth year], 36 y.o.   MRN: [id number]  HPI Ms. Caitlin Clayton is a 36 y/o female with a history of pseudotumor cerebri who comes into clinic today for regularly scheduled follow up. She is seen with her sister, Caitlin Clayton, who is her caretaker.   Overall she states that she is doing well. Her headaches are under good control. She is taking all of her medications, and is not having any symptoms at this time. She still has some trouble with her memory, but she writes things down and feels like she is doing well over all.   She states that she needs to have an eye exam, as she is having trouble with reading at the moment. She wears glasses for reading, but does not have them with her.   She has no other complaints today.   Review of Systems No new headaches No decrease in hearing No change of vision No dizziness No lightheadedness     Objective:   Physical Exam General: Well appearing woman in no acute distress HEENT: PERRL, EOMI, TM normal bilaterally with good light reflex, MMM, oropharynx is non-erythematous, neck is supple and without adenopathy Pulm: Lungs are clear to ausculation bilaterally, normal effort of breathing CV: RRR, no murmurs, rubs or gallops  Vision exam:  Right eye: 20/60 Left eye: 20/160    Assessment/Plan  1. Headaches: Well controlled on current regimen. Discussed red flags that should make her seek immediate follow up with her neurosurgeon.  2. Vision: Given history of papilledema, it is not unreasonable that she should be followed by an ophthalmologist. Vision test performed in the office, 20/160 in the left eye and 20/60 in the right.Will give her a referral to ophthalmology at [facility].   Attending Physician Note  Patient seen and examined by me in office visit conducted simulataneously with medical student Caitlin Clayton (MS3, [employer] [employer]), and I agree with his documentation as above, with following  additions: Caitlin Clayton reports that she is doing well overall; headaches have not returned since increase in dosing of her amitriptyline.  Does report short-term memory lapses but not affecting her function. Sister Caitlin Clayton is her caregiver and keeps track of her meds, drives her to appts, etc.  Caitlin Clayton is planning to take a 10-day driving trip to [place] this evening, to visit family and get away. Her father and his girlfriend are driving.   Caitlin Clayton reports recent deterioration in her vision; blurred vision in both eyes.  She usually uses corrective glasses, which she did not bring today.  Denies visual field defects or trouble with peripheral vision.  Worse when she tries to look at things that are far away.  No eye pain or lacrimation.    Exam Well appearing, pleasant, upbeat mood. No apparent distress HEENT neck supple, no cervical adenopathy.  Sclerae clear; EOMI and PERRL.  Snellen vision test performed without her usual correction (see documentation for results).   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  Her headaches have been well controlled, sleep is better.  I have discussed with her my recommendation that she be seen by Ophthalmology at [facility] where she has been seen in the past in conjunction with Neurosurgery, for evaluation of her recent reported visual changes.  She is amenable to this. I will plan to see her back at the end of the year or Jan 2014, unless other needs arise  in the interim.  Caitlin Compton, MD

## 2012-03-20 NOTE — Patient Instructions (Addendum)
It was great to see you today.  We will give you a referral to see the ophthalmologist to get your vision checked.   Follow up with [place] in December.

## 2012-03-21 DIAGNOSIS — H539 Unspecified visual disturbance: Secondary | ICD-10-CM | POA: Insufficient documentation

## 2012-03-21 NOTE — Assessment & Plan Note (Signed)
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  Her headaches have been well controlled, sleep is better.  I have discussed with her my recommendation that she be seen by Ophthalmology at [facility] where she has been seen in the past in conjunction with Neurosurgery, for evaluation of her recent reported visual changes.  She is amenable to this. I will plan to see her back at the end of the year or Jan 2014, unless other needs arise in the interim.  Paula Compton, MD

## 2012-04-24 ENCOUNTER — Other Ambulatory Visit: Payer: Self-pay | Admitting: Family Medicine

## 2012-11-28 ENCOUNTER — Other Ambulatory Visit: Payer: Self-pay | Admitting: Family Medicine

## 2012-12-28 ENCOUNTER — Other Ambulatory Visit: Payer: Self-pay | Admitting: Family Medicine

## 2013-01-14 ENCOUNTER — Other Ambulatory Visit: Payer: Self-pay | Admitting: Family Medicine

## 2013-01-29 ENCOUNTER — Other Ambulatory Visit: Payer: Self-pay | Admitting: Family Medicine

## 2013-03-30 ENCOUNTER — Other Ambulatory Visit: Payer: Self-pay | Admitting: Family Medicine

## 2013-04-01 ENCOUNTER — Other Ambulatory Visit: Payer: Self-pay | Admitting: *Deleted

## 2013-04-01 NOTE — Telephone Encounter (Signed)
Please have patient follow up with Dr Mauricio Po for this, I do not see indication for medication,not sure if she is to continue it.

## 2013-04-03 ENCOUNTER — Other Ambulatory Visit: Payer: Self-pay | Admitting: Family Medicine

## 2013-04-08 ENCOUNTER — Other Ambulatory Visit: Payer: Self-pay | Admitting: Family Medicine

## 2013-04-08 MED ORDER — AMITRIPTYLINE HCL 75 MG PO TABS: ORAL_TABLET | ORAL | Status: DC

## 2013-04-08 MED ORDER — GABAPENTIN 300 MG PO CAPS: ORAL_CAPSULE | ORAL | Status: DC

## 2013-04-23 ENCOUNTER — Ambulatory Visit (INDEPENDENT_AMBULATORY_CARE_PROVIDER_SITE_OTHER): Payer: Medicare Other | Admitting: Family Medicine

## 2013-04-23 ENCOUNTER — Encounter: Payer: Self-pay | Admitting: Family Medicine

## 2013-04-23 VITALS — BP 104/71 | HR 73 | Temp 98.3°F | Ht 62.5 in | Wt 248.0 lb

## 2013-04-23 DIAGNOSIS — R635 Abnormal weight gain: Secondary | ICD-10-CM | POA: Insufficient documentation

## 2013-04-23 DIAGNOSIS — B354 Tinea corporis: Secondary | ICD-10-CM

## 2013-04-23 LAB — TSH: TSH: 0.445 u[IU]/mL (ref 0.350–4.500)

## 2013-04-23 LAB — CBC
MCH: 30.3 pg (ref 26.0–34.0)
MCHC: 33.7 g/dL (ref 30.0–36.0)
Platelets: 450 10*3/uL — ABNORMAL HIGH (ref 150–400)
RBC: 3.89 MIL/uL (ref 3.87–5.11)

## 2013-04-23 LAB — BASIC METABOLIC PANEL
BUN: 9 mg/dL (ref 6–23)
Calcium: 9.3 mg/dL (ref 8.4–10.5)
Chloride: 105 mEq/L (ref 96–112)
Creat: 0.84 mg/dL (ref 0.50–1.10)

## 2013-04-23 MED ORDER — AMITRIPTYLINE HCL 75 MG PO TABS: ORAL_TABLET | ORAL | Status: DC

## 2013-04-23 MED ORDER — KETOCONAZOLE 2 % EX SHAM: MEDICATED_SHAMPOO | CUTANEOUS | Status: DC

## 2013-04-23 MED ORDER — GABAPENTIN 300 MG PO CAPS: ORAL_CAPSULE | ORAL | Status: DC

## 2013-04-23 MED ORDER — ESTRADIOL 2 MG PO TABS: ORAL_TABLET | ORAL | Status: DC

## 2013-04-23 NOTE — Patient Instructions (Addendum)
It was a pleasure to see you today.   1. Labs today; Home number is [phone], or I may call Caitlin Clayton to give the results (or letter if all is normal).  2. We did the permanent handicapped placard certification today.  3. Medications refilled; added Ketoconazole shampoo 2%, apply to body as body wash 2 times weekly.   4. Doctor's note for Caitlin Clayton to excuse for work today.

## 2013-04-24 NOTE — Assessment & Plan Note (Signed)
Made as a visual diagnosis, no scraping done today.  For empiric treatment with ketoconazole shampoo/body wash twice weekly.  For scraping if not resolved/improved.

## 2013-04-24 NOTE — Assessment & Plan Note (Signed)
Labs ordered today; for follow up after labs are back.

## 2013-04-24 NOTE — Progress Notes (Signed)
  Subjective:    Patient ID: Caitlin Clayton, female    DOB: August 22, 1975, 37 y.o.   MRN: [id number]  HPI Caitlin Clayton is here today accompanied by her sister [name]; she reports that she is doing very well overall.  Sees her neurosurgeon at [facility] on Dec 21st; saw her ophtho there on Aug 29th, was prescribed new glasses.  Has not had HAs to speak of in recent times.  Still takes the Elavil to prevent headaches.  Still with some head pain along the right temple with light touch to the site of her prior craniotomy, not painful if not touched.   Weight gain of 19 lbs in the past 12 months; no change in diet.  She rides a stationary bike for 30 minutes twice weekly.  Has had trouble with getting winded easily when exercising in the past year, must stop to rest when walking across parking lot.  Requests that I complete her handicapped placard, based upon her inability to walk 200 feet without stopping to rest.  She also gets disoriented easily due to short-term memory loss that seems temporally related to her history of subdural hemorrhage. I am completing form with request for Permanent placard, based on her limited ability to walk more than 200 feet consecutively without rest) and the neurologic sequelae of her brain injury.   ROS: Today she has no headaches, no nausea/vomiting and no visual changes.  Does report skin rash along upper chest, sometimes itches.    Review of Systems     Objective:   Physical Exam  Well appearing, no apparent distress HEENT Neck supple. No skin changes over scalp/temple areas.  Mild tenderness to palpate over right temple.  EOMI.   Clear oropharynx. No cervical adenopathy or thyroid nodularity/tenderness.  COR Regular S1S2, no extra sounds PULM Clear bilaterally, no rales or wheezes SKIN Upper chest with area of small hypopigmented macules of varying sizes, not raised.  Similar lesions along mid-back at level of bra strap.      Assessment & Plan:

## 2013-04-25 ENCOUNTER — Encounter: Payer: Self-pay | Admitting: Family Medicine

## 2013-11-07 ENCOUNTER — Other Ambulatory Visit: Payer: Self-pay | Admitting: Family Medicine

## 2013-12-11 ENCOUNTER — Other Ambulatory Visit: Payer: Self-pay | Admitting: Family Medicine

## 2014-01-15 ENCOUNTER — Other Ambulatory Visit: Payer: Self-pay | Admitting: Family Medicine

## 2014-02-12 ENCOUNTER — Other Ambulatory Visit: Payer: Self-pay | Admitting: Family Medicine

## 2014-03-12 ENCOUNTER — Other Ambulatory Visit: Payer: Self-pay | Admitting: Family Medicine

## 2014-03-25 ENCOUNTER — Ambulatory Visit: Payer: Medicare Other | Admitting: Family Medicine

## 2014-04-01 ENCOUNTER — Encounter: Payer: Self-pay | Admitting: Family Medicine

## 2014-04-01 ENCOUNTER — Ambulatory Visit (INDEPENDENT_AMBULATORY_CARE_PROVIDER_SITE_OTHER): Payer: Medicare Other | Admitting: Family Medicine

## 2014-04-01 VITALS — BP 133/86 | HR 96 | Temp 98.2°F | Ht 62.5 in | Wt 242.8 lb

## 2014-04-01 DIAGNOSIS — E669 Obesity, unspecified: Secondary | ICD-10-CM

## 2014-04-01 DIAGNOSIS — Z8719 Personal history of other diseases of the digestive system: Secondary | ICD-10-CM

## 2014-04-01 DIAGNOSIS — R51 Headache: Secondary | ICD-10-CM

## 2014-04-01 DIAGNOSIS — R635 Abnormal weight gain: Secondary | ICD-10-CM

## 2014-04-01 LAB — BASIC METABOLIC PANEL
BUN: 8 mg/dL (ref 6–23)
CO2: 27 mEq/L (ref 19–32)
Calcium: 9.6 mg/dL (ref 8.4–10.5)
Chloride: 103 mEq/L (ref 96–112)
Creat: 0.85 mg/dL (ref 0.50–1.10)
Glucose, Bld: 85 mg/dL (ref 70–99)
POTASSIUM: 5.2 meq/L (ref 3.5–5.3)
Sodium: 138 mEq/L (ref 135–145)

## 2014-04-01 MED ORDER — DOCUSATE SODIUM 100 MG PO CAPS: 100.0000 mg | ORAL_CAPSULE | Freq: Two times a day (BID) | ORAL | Status: AC

## 2014-04-01 MED ORDER — GABAPENTIN 300 MG PO CAPS: ORAL_CAPSULE | ORAL | Status: DC

## 2014-04-01 MED ORDER — KETOCONAZOLE 2 % EX SHAM: MEDICATED_SHAMPOO | CUTANEOUS | Status: AC

## 2014-04-01 MED ORDER — ESTRADIOL 2 MG PO TABS: ORAL_TABLET | ORAL | Status: DC

## 2014-04-01 MED ORDER — AMITRIPTYLINE HCL 75 MG PO TABS: ORAL_TABLET | ORAL | Status: DC

## 2014-04-01 NOTE — Progress Notes (Signed)
   Subjective:    Patient ID: Caitlin Clayton, female    DOB: Apr 08, 1976, 38 y.o.   MRN: [id number]  HPI Patient here for follow up with her sister/caregiver, Caitlin Clayton (cell [phone]).  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  She has continued to have memory difficulties since then, and her sister Caitlin Clayton has moved in with her and assumed her care since.  Takes Elavil 75mg  nightly for sleep and to prophylax against headaches.  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  No headache at the moment.  She had been following with ophthalmology, neurosurgery and neurology at [facility] for this; follow-ups have been less frequent in the past year or so.  Also with longstanding history of intermittent constipation with abdominal pain, that has been unchanged for over 20 years (since adolescence).  Family reports that Caitlin Clayton did undergo evaluation for this in the past, was diagnosed with IBS. Her flares have caused Caitlin Clayton to miss 2 days this past school year (to watch Divine, administer laxatives).  No blood in stool, no mucus.  When not having flare, has 1 solid BM/day while using Colace 100mg /day and vegetable-based laxative.  When flares, can go a week without a BM, and has abdominal pain until she passes stool.    PSurgHx; S/p hysterectomy for endometriosis.  Continues to have hot flashes despite taking Estradiol daily.   Soc Hx; Nonsmoker. ROS: No VTE history.   Review of Systems     Objective:   Physical Exam Well appearing, no apparent distress HEENT Neck supple, no cervical adenopathy. No tenderness to palpate over temples bilaterally. EOMI. No frontal or sinus tenderness bilaterally with palpation.  COR Regular S1S2, no extra sounds PULM Clear bilaterally, no  rales or wheezes.  ABD Soft, nontender, nondistended. No organomegaly or masses noted on palpation.  EXTS: no distal edema in ankles noted.  Speech is fluid; not pressured. Word finding is appropriate.       Assessment & Plan:

## 2014-04-01 NOTE — Patient Instructions (Signed)
It was a pleasure to see you today.   I completed the FMLA paperwork for Caitlin Clayton (Caitlin Clayton), the originals returned to you.  Caitlin Clayton's phone [phone]).  I refilled your medications today.  Labs today to check glucose, as well as thyroid function.   Follow up 6 months with Dr Lindell Noe.    PLEASE UPDATE PATIENT TELEPHONE IN CHART.

## 2014-04-02 ENCOUNTER — Encounter: Payer: Self-pay | Admitting: Family Medicine

## 2014-04-02 DIAGNOSIS — R51 Headache: Secondary | ICD-10-CM

## 2014-04-02 DIAGNOSIS — Z8719 Personal history of other diseases of the digestive system: Secondary | ICD-10-CM | POA: Insufficient documentation

## 2014-04-02 DIAGNOSIS — R519 Headache, unspecified: Secondary | ICD-10-CM | POA: Insufficient documentation

## 2014-04-02 LAB — TSH: TSH: 0.66 u[IU]/mL (ref 0.350–4.500)

## 2014-04-02 NOTE — Assessment & Plan Note (Addendum)
To continue periodic DM screening.  Discussed maintenance of healthy dietary habits; maintaining physical activity as much as possible. To check TSH as part of screening in setting of weight gain.

## 2014-04-02 NOTE — Assessment & Plan Note (Signed)
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 Both patient and sister relate that the character of stooling (usually 1 formed BM/day; at worst is 1 BM/week) is unchanged. Defer option for re-evaluation at this time. Instructed to notify me or come in if worsening, if blood present, or if decide to pursue further.

## 2014-07-31 ENCOUNTER — Telehealth: Payer: Self-pay | Admitting: Family Medicine

## 2014-07-31 NOTE — Telephone Encounter (Signed)
Sister called and just wanted Dr. Lindell Noe to know that they are filling out her sister's FMLA papers and he might get a request for her records. jw

## 2014-08-19 ENCOUNTER — Ambulatory Visit (INDEPENDENT_AMBULATORY_CARE_PROVIDER_SITE_OTHER): Payer: Medicare Other | Admitting: Family Medicine

## 2014-08-19 ENCOUNTER — Encounter: Payer: Self-pay | Admitting: Family Medicine

## 2014-08-19 VITALS — BP 129/85 | HR 83 | Temp 98.2°F | Ht 62.5 in | Wt 251.0 lb

## 2014-08-19 DIAGNOSIS — R413 Other amnesia: Secondary | ICD-10-CM

## 2014-08-19 DIAGNOSIS — R51 Headache: Secondary | ICD-10-CM

## 2014-08-19 DIAGNOSIS — G8929 Other chronic pain: Secondary | ICD-10-CM

## 2014-08-19 DIAGNOSIS — G932 Benign intracranial hypertension: Secondary | ICD-10-CM

## 2014-08-19 LAB — CBC
HCT: 38.5 % (ref 36.0–46.0)
HEMOGLOBIN: 12.6 g/dL (ref 12.0–15.0)
MCH: 30.4 pg (ref 26.0–34.0)
MCHC: 32.7 g/dL (ref 30.0–36.0)
MCV: 92.8 fL (ref 78.0–100.0)
MPV: 8.5 fL — AB (ref 8.6–12.4)
Platelets: 463 10*3/uL — ABNORMAL HIGH (ref 150–400)
RBC: 4.15 MIL/uL (ref 3.87–5.11)
RDW: 13.9 % (ref 11.5–15.5)
WBC: 8.9 10*3/uL (ref 4.0–10.5)

## 2014-08-19 MED ORDER — OXYCODONE-ACETAMINOPHEN 5-325 MG PO TABS: 1.0000 | ORAL_TABLET | Freq: Two times a day (BID) | ORAL | Status: DC | PRN

## 2014-08-19 NOTE — Patient Instructions (Signed)
Labs today for memory loss.   CT scan with contrast to evaluate headaches and memory loss.   Percocet 5/325 #30, take 1 daily as needed for SEVERE PAIN.  This is not a routine medication or long-term treatment.    Neurology consultation for headache and memory loss.   Follow up with Dr Lindell Noe in 1-2 months.

## 2014-08-20 ENCOUNTER — Encounter: Payer: Self-pay | Admitting: Family Medicine

## 2014-08-20 LAB — BASIC METABOLIC PANEL
BUN: 7 mg/dL (ref 6–23)
CHLORIDE: 103 meq/L (ref 96–112)
CO2: 29 meq/L (ref 19–32)
Calcium: 10.1 mg/dL (ref 8.4–10.5)
Creat: 0.83 mg/dL (ref 0.50–1.10)
Glucose, Bld: 89 mg/dL (ref 70–99)
POTASSIUM: 4.5 meq/L (ref 3.5–5.3)
SODIUM: 140 meq/L (ref 135–145)

## 2014-08-20 LAB — TSH: TSH: 0.514 u[IU]/mL (ref 0.350–4.500)

## 2014-08-20 LAB — RPR

## 2014-08-20 NOTE — Assessment & Plan Note (Signed)
Patient with complicated history of pseudotumor cerebri, underwent stenting of transverse sinus in December 2011 and developed subdural hematoma, required R hemicraniotomy and inpatient rehab. Her sister [name] (primary caregiver for nearly 4 years) says that short-term memory and concern for consequences of her lapses in memory have declined markedly since October 2015.  Brief MMSE today with deficiencies only in 5-minute recall (2/3).  Also with worsening of her headaches, to the point of beginning to take sister's Percocet 5/325 one tablet daily on average, which does resolve the headaches.   Referral to Neurology for evaluation of these problems in this complex patient. In the interim I am issued a one-time prescription for Percocet #30 to be used only in cases of extreme pain, this is not to be considered an ongoing part of her pain management.  Labds related to the memory loss (TSH, RPR); noncontrast CT brain.  Patient's sister reports she has had sequential imaging at [facility] however Care Everywhere portal does not give reports for neuroimaging more recently than 2012.

## 2014-08-20 NOTE — Progress Notes (Signed)
   Subjective:    Patient ID: Caitlin Clayton, female    DOB: July 31, 1976, 39 y.o.   MRN: [id number]  HPI Patient here with sister/caregiver [name], for multiple complaints.  Patient underwent transverse sinus shunting in Dec 2011 for pseudotumor cerebri and subsequently developed subdural hematoma and required R hemicraniotomy.  She has suffered from recurrent HAs since then (right parietal/temporal) which have responded to amitriptyline in the past. Sister says that over the past 2 months, the HAs are getting worse and are not responding to the TCA.  Has started to give patient sister's prescribed Percocet, which helps relieve the pain (5/325, one tab). No associated nausea/vomiting. Perhaps some difficulty reading small print that has worsened.   Also reports worsening short-term memory. Gets the mail and forgets where she put it; sister has found letters/bills and other important mail that Caitlin Clayton has left around the house; found a notice about Caitlin Clayton's disability that was 2 months old and contained time-sensitive information.    Wynonna has an appointment with [facility] Neurosurgery on Feb 20th; is trying to establish an appointment with Dr Harvel Ricks, the neurosurgeon who operated on her and had been following her, but who has moved to [place].    Review of Systems Sleeps from 10pm to 830am without interruption; feels rested in mornings. No fevers or chills. No headache today. No floaters or flashes of light in vision. No nausea or vomiting with headaches. HA does not wake her from sleep.      Objective:   Physical Exam Well appearing, speech normal cadence.   Neck supple, no cervical adenopathy.; No point tenderness over parietal or temporal regions of scalp bilaterally. EOMI, PERRL. Funduscopic evaluation (non-dilated) with no papilledema noted (crisp discs).  No sinus tenderness.  COR regular S1S2 PULM Clear bilaterally, no rales or wheezes Walks in room without assistance.  MMSE: oriented  to place ("Dr Rosario Jacks office"), date (Day of week, month, day, year). Immediate 3-item recall 3/3; five-minute recall 2/3.  Associations appropriate ("how are dog and cat similar? "animals with four legs"; Table and chair? "furniture").  Can name 2 objects (pen, watch).  Can follow written command.  Can follow 3-part command.  Can write a complete sentence.        Assessment & Plan:

## 2014-08-25 ENCOUNTER — Ambulatory Visit (HOSPITAL_COMMUNITY): Payer: Medicare Other

## 2014-09-01 ENCOUNTER — Ambulatory Visit (HOSPITAL_COMMUNITY)
Admission: RE | Admit: 2014-09-01 | Discharge: 2014-09-01 | Disposition: A | Discharge status: Home / Self Care | Payer: Medicare Other | Source: Ambulatory Visit | Attending: Family Medicine | Admitting: Family Medicine

## 2014-09-01 ENCOUNTER — Telehealth: Payer: Self-pay | Admitting: *Deleted

## 2014-09-01 DIAGNOSIS — R51 Headache: Secondary | ICD-10-CM | POA: Diagnosis not present

## 2014-09-01 DIAGNOSIS — R413 Other amnesia: Secondary | ICD-10-CM | POA: Insufficient documentation

## 2014-09-01 MED ORDER — IOHEXOL 300 MG/ML  SOLN: 80.0000 mL | Freq: Once | INTRAMUSCULAR | Status: AC | PRN

## 2014-09-01 NOTE — Telephone Encounter (Signed)
Patient's sister calling to verify appointment, when she was informed appointment time and arrival time, she was rude stated that is what she asked the first time, call went silent, she said something else, I asked her to repeat what she said she yelled "I was not talking to you" so I stated that I was going to hang up the phone. She called back and was yelling that I told her to hang up the phone, asked her if there was anything else that I can do for her and she hung up the phone. This is a new patient seeing Dr Krista Blue.

## 2014-09-03 ENCOUNTER — Telehealth: Payer: Self-pay | Admitting: Family Medicine

## 2014-09-03 NOTE — Telephone Encounter (Signed)
Called patient to discuss results of CT scan.  She has appointment with neurologist tomorrow morning for specialty evaluation of her worsening headaches and short-term memory loss. She says the HA are about the same as at our last visit, not worsening or getting better.  Caitlin Clayton

## 2014-09-04 ENCOUNTER — Ambulatory Visit (INDEPENDENT_AMBULATORY_CARE_PROVIDER_SITE_OTHER): Payer: Medicare Other | Admitting: Neurology

## 2014-09-04 ENCOUNTER — Encounter: Payer: Self-pay | Admitting: Neurology

## 2014-09-04 VITALS — BP 122/83 | HR 80 | Ht 60.0 in | Wt 245.0 lb

## 2014-09-04 DIAGNOSIS — R569 Unspecified convulsions: Secondary | ICD-10-CM

## 2014-09-04 DIAGNOSIS — R413 Other amnesia: Secondary | ICD-10-CM

## 2014-09-04 DIAGNOSIS — R410 Disorientation, unspecified: Secondary | ICD-10-CM

## 2014-09-04 DIAGNOSIS — H547 Unspecified visual loss: Secondary | ICD-10-CM | POA: Insufficient documentation

## 2014-09-04 DIAGNOSIS — F8081 Childhood onset fluency disorder: Secondary | ICD-10-CM | POA: Insufficient documentation

## 2014-09-04 DIAGNOSIS — R635 Abnormal weight gain: Secondary | ICD-10-CM

## 2014-09-04 MED ORDER — TOPIRAMATE 100 MG PO TABS: ORAL_TABLET | ORAL | Status: AC

## 2014-09-04 MED ORDER — RIZATRIPTAN BENZOATE 5 MG PO TBDP: 5.0000 mg | ORAL_TABLET | ORAL | Status: AC | PRN

## 2014-09-04 NOTE — Procedures (Signed)
   HISTORY: 39 years old female, is past medical history of right-sided subdural hematoma, status post right craniotomy, with chronic right parietal, occipital region encephalomalacia, presenting with worsening memory trouble, staring spells, intermittent headaches.  TECHNIQUE:  16 channel EEG was performed based on standard 10-16 international system. One channel was dedicated to EKG, which has demonstrates normal sinus rhythm.  Upon awakening, the left side posterior background activity was well-developed, in alpha range, with amplitude of 25 microvoltage, reactive to eye opening and closure. There was marked asymmetry at the background activity, right side has higher amplitude, 40 microvoltage, irregular, mixed alphan, and theta range activity, there was also frequent sharp transient involving T4, F8, C4,  Photic stimulation was performed, there was continued evidence of asymmetry of background activity.  Hyperventilation was not performed  Stage II sleep was achieved, as evident by K complex, sleep spindles, there was continued evidence of  left side slowing, breech rhythm, frequent C4, T4, F8 sharp transient   CONCLUSION: This is  an abnormal awake and asleep  EEG.  There is electrodiagnostic evidence of left hemisphere slowing breach rhythm, irritability, consistent with her history of right craniotomy, she is at high risk for developed complex partial seizure.

## 2014-09-04 NOTE — Progress Notes (Signed)
PATIENT: Caitlin Clayton DOB: 01-May-1976  HISTORICAL  Caitlin Clayton is a 39 yo RH referred by her primary care physician Dr. Lindell Noe, accompanied by her sister Caitlin Clayton for evaluation of increased memory trouble, continued headaches  She has suffered migraine since teens, getting worse around 2009, was diagnosed with idiopathic intracranial hypertension, pseudotumor cerebri, underwent venous stenting, subdural hematoma with emergency right craniotomy in December 2011 at [facility], since the surgery, she developed left visual field deficit, mild left side difficulty, had right cranial bone flap placed in April 2012  Surgery did help her headaches some, but postsurgically, she continue have moderate to severe headaches couple times each week, right parietal region, severe pounding headaches, lasting for a few hours, she has tried over-the-counter Tylenol, ibuprofen without helping, Percocet helps, but always preferring to sleep, she is also taking gabapentin 300 mg 3 times a day,  She used to work as a [employer], now on disability since surgery, lives with her sister, she has worsening memory trouble, difficulty focusing, staring spells, worsening visual trouble  I have reviewed ophthalmology evaluation by [facility] in August 2014 left hemianopia, severe bilateral peripheral visual field deficit  I reviewed CAT scan of the brain without contrast January 2016 with patient's and her family, Extensive chronic ischemic changes on the right. No acute infarct. no intracranial hemorrhage. Right transverse sinus stent unchanged in position. The right transverse sinus appears patent.  Laboratory evaluation showed no significant abnormality on CBC BMP, TSH January 2016  REVIEW OF SYSTEMS: Full 14 system review of systems performed and notable only for weight gain, blurry vision, loss of vision, eye pain, memory loss, headaches, weakness, slurred speech, restless leg,  not enough sleep, disinterested activities,  ALLERGIES: Allergies  Allergen Reactions  . Latex Itching  . Tramadol Itching  . Tramadol Hcl     REACTION: hives    HOME MEDICATIONS: Current Outpatient Prescriptions on File Prior to Visit  Medication Sig Dispense Refill  . amitriptyline (ELAVIL) 75 MG tablet TAKE ONE TABLET BY MOUTH AT BEDTIME 90 tablet 3  . aspirin 81 MG tablet Take 81 mg by mouth daily.    Marland Kitchen docusate sodium (COLACE) 100 MG capsule Take 1 capsule (100 mg total) by mouth 2 (two) times daily. 60 capsule 6  . estradiol (ESTRACE) 2 MG tablet TAKE ONE TABLET BY MOUTH ONCE DAILY 90 tablet 3  . gabapentin (NEURONTIN) 300 MG capsule TAKE ONE CAPSULE BY MOUTH THREE TIMES DAILY 270 capsule 3  . ketoconazole (NIZORAL) 2 % shampoo APPLY TOPICALLY TWO TIMES A WEEK 120 mL 0  . multivitamin-iron-minerals-folic acid (CENTRUM) chewable tablet Chew 1 tablet by mouth daily.    Marland Kitchen oxyCODONE-acetaminophen (ROXICET) 5-325 MG per tablet Take 1 tablet by mouth every 12 (twelve) hours as needed for severe pain. 30 tablet 0   No current facility-administered medications on file prior to visit.    PAST MEDICAL HISTORY: Past Medical History  Diagnosis Date  . Memory loss   . Cluttered speech   . Weight gain   . Vision loss   . Confusion   . HA (headache)     PAST SURGICAL HISTORY: Past Surgical History  Procedure Laterality Date  . Abdominal hysterectomy    . Uterine fibroid surgery    Dec 19th 2011 at [facility], for intracranial bleeding, right craniotomy,  April 24th 2014, bone flap   FAMILY HISTORY: Family History  Problem Relation Age of Onset  . Migraines Mother   .  High blood pressure Father     SOCIAL HISTORY:  History   Social History  . Marital Status: divorced    Spouse Name: N/A    Number of Children: 3  . 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 Smoking status: Never Smoker   .  Smokeless tobacco: Never Used  . Alcohol Use: No  . Drug Use: No  . Sexual Activity: Not on file   Other Topics Concern  . Not on file   Social History Narrative   Patient lives at home with her sister Caitlin Clayton).   Education high school    Diaabled.   Right handed.     PHYSICAL EXAM   Filed Vitals:   09/04/14 1038  BP: 122/83  Pulse: 80  Height: 5' (1.524 m)  Weight: 245 lb (111.131 kg)    Not recorded      Body mass index is 47.85 kg/(m^2).   Generalized: In no acute distress  Neck: Supple, no carotid bruits   Cardiac: Regular rate rhythm  Pulmonary: Clear to auscultation bilaterally  Musculoskeletal: No deformity  Neurological examination  Mentation: Alert oriented to time, place, history taking, and causual conversation, Mini-Mental Status Examination 29 out of 30, she is not oriented to date, depend on her sister to provide history  Cranial nerve II-XII: Pupils were equal round reactive to light. Extraocular movements were full.  Visual field were showed left hemianopia on confrontational test. Bilateral fundi were sharp.  Facial sensation and strength were normal. Hearing was intact to finger rubbing bilaterally. Uvula tongue midline.  Head turning and shoulder shrug and were normal and symmetric.Tongue protrusion into cheek strength was normal.  Motor: Normal tone, bulk and strength.  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   DIAGNOSTIC DATA (LABS, IMAGING, TESTING) - I reviewed patient records, labs, notes, testing and imaging myself where available.  Lab Results  Component Value  Date   WBC 8.9 08/19/2014   HGB 12.6 08/19/2014   HCT 38.5 08/19/2014   MCV 92.8 08/19/2014   PLT 463* 08/19/2014      Component Value Date/Time   NA 140 08/19/2014 1104   K 4.5 08/19/2014 1104   CL 103 08/19/2014 1104   CO2 29 08/19/2014 1104   GLUCOSE 89 08/19/2014 1104   BUN 7 08/19/2014 1104   CREATININE 0.83 08/19/2014 1104   CREATININE .8 01/02/2010 2137   CALCIUM 10.1 08/19/2014 1104   PROT 7.0 06/06/2011 1231   ALBUMIN 3.8 06/06/2011 1231   AST 12 06/06/2011 1231   ALT 8 06/06/2011 1231   ALKPHOS 57 06/06/2011 1231   BILITOT 0.2* 06/06/2011 1231   GFRNONAA >60 01/02/2010 2137   GFRAA  01/02/2010 2137    >60        The eGFR has been calculated using the MDRD equation. This calculation has not been validated in all clinical situations. eGFR's persistently <60 mL/min signify possible Chronic Kidney Disease.   Lab Results  Component Value Date   CHOL 211* 09/30/2009   HDL 68 09/30/2009   LDLCALC 129* 09/30/2009   TRIG 72 09/30/2009   CHOLHDL 3.1  Ratio 09/30/2009   No results found for: HGBA1C No results found for: VITAMINB12 Lab Results  Component Value Date   TSH 0.514 08/19/2014     ASSESSMENT AND PLAN  Caitlin Clayton is a 39 y.o. female with past medical history of pseudotumor cerebri, status post a right transverse sinus stenting, developed subdural hematoma, required emergency right craniotomy, repeat CAT scan so large-size chronic right parietal, occipital region encephalomalacia  1, her headache has migraine features, she is also at high risk developed complex partial seizure 2. EEG 3. Topamax, titrating to 100 mg twice a day  4. Maxalt as needed  Orders Placed This Encounter  Procedures  . EEG adult     Return in about 1 month (around 10/05/2014).   Marcial Pacas, M.D. Ph.D.  [facility] [address] [place], [place] [address] [phone]

## 2014-10-13 ENCOUNTER — Ambulatory Visit (INDEPENDENT_AMBULATORY_CARE_PROVIDER_SITE_OTHER): Payer: Medicare Other | Admitting: Neurology

## 2014-10-13 ENCOUNTER — Encounter: Payer: Self-pay | Admitting: Neurology

## 2014-10-13 VITALS — BP 130/81 | HR 93 | Ht 60.0 in | Wt 245.0 lb

## 2014-10-13 DIAGNOSIS — R413 Other amnesia: Secondary | ICD-10-CM

## 2014-10-13 DIAGNOSIS — R410 Disorientation, unspecified: Secondary | ICD-10-CM

## 2014-10-13 DIAGNOSIS — R569 Unspecified convulsions: Secondary | ICD-10-CM

## 2014-10-13 NOTE — Progress Notes (Addendum)
PATIENT: Fritz Pickerel DOB: 09-07-1975  HISTORICAL  MARANATHA GROSSI is a 39 yo RH referred by her primary care physician Dr. Lindell Noe, accompanied by her sister [name] for evaluation of increased memory trouble, continued headaches  She has suffered migraine since teens, getting worse around 2009, was diagnosed with idiopathic intracranial hypertension, pseudotumor cerebri, underwent venous stenting, subdural hematoma with emergency right craniotomy in December 2011 at [facility], since the surgery, she developed left visual field deficit, mild left side difficulty, had right cranial bone flap placed in April 2012  Surgery did help her headaches some, but postsurgically, she continue have moderate to severe headaches couple times each week, right parietal region, severe pounding headaches, lasting for a few hours, she has tried over-the-counter Tylenol, ibuprofen without helping, Percocet helps, but always put her into sleep, she is also taking gabapentin 300 mg 3 times a day,  She used to work as a [employer], now on disability since surgery, lives with her sister, she has worsening memory trouble, difficulty focusing, staring spells, worsening visual trouble  I have reviewed ophthalmology evaluation by [facility] in August 2014 left hemianopia, severe bilateral peripheral visual field deficit  I reviewed CAT scan of the brain without contrast January 2016 with patient's and her family, Extensive chronic ischemic changes on the right. No acute infarct. no intracranial hemorrhage. Right transverse sinus stent unchanged in position. The right transverse sinus appears patent.  Laboratory evaluation showed no significant abnormality on CBC BMP, TSH January 2016  UPDATE Feb 29th 2016: EEG in Sep 04 2014: was abnormal. There is  electrodiagnostic evidence of right hemisphere slowing breach  rhythm, irritability, consistent with her history of right  craniotomy,  she is at high risk for developed complex partial seizure. She is now taking Topamax 124m bid, she is more confused more memory loss, but did help her headache, no complains of headaches since Jan 2016,   She stay at home by herself during the day, there was no seizure like activity notice,  Sister complains that her character has changed since the injury, no longer arguing back with her,   REVIEW OF SYSTEMS: Full 14 system review of systems performed and notable only for memory loss, headaches, speech difficulty, weakness, confusion, excessive thirst, loss of vision ALLERGIES: Allergies  Allergen Reactions  . Latex Itching  . Tramadol Itching  . Tramadol Hcl     REACTION: hives    HOME MEDICATIONS: Current Outpatient Prescriptions on File Prior to Visit  Medication Sig Dispense Refill  . amitriptyline (ELAVIL) 75 MG tablet TAKE ONE TABLET BY MOUTH AT BEDTIME 90 tablet 3  . aspirin 81 MG tablet Take 81 mg by mouth daily.    .Marland Kitchendocusate sodium (COLACE) 100 MG capsule Take 1 capsule (100 mg total) by mouth 2 (two) times daily. 60 capsule 6  . estradiol (ESTRACE) 2 MG tablet TAKE ONE TABLET BY MOUTH ONCE DAILY (Patient taking differently: 20 mg. TAKE ONE TABLET BY MOUTH ONCE DAILY) 90 tablet 3  . gabapentin (NEURONTIN) 300 MG capsule TAKE ONE CAPSULE BY MOUTH THREE TIMES DAILY (Patient taking differently: 300 mg. TAKE ONE CAPSULE BY MOUTH THREE TIMES DAILY) 270 capsule 3  . ketoconazole (NIZORAL) 2 % shampoo APPLY TOPICALLY TWO TIMES A WEEK 120 mL 0  . multivitamin-iron-minerals-folic acid (CENTRUM) chewable tablet Chew 1 tablet by mouth daily.    . rizatriptan (MAXALT-MLT) 5 MG disintegrating tablet Take 1 tablet (5 mg total) by mouth as needed. May  repeat in 2 hours if needed 15 tablet 11  . topiramate (TOPAMAX) 100 MG tablet 1/2 tab po bid xone week, then one tab bid 60 tablet 11   No current facility-administered medications on file prior to visit.    PAST MEDICAL HISTORY: Past  Medical History  Diagnosis Date  . Memory loss   . Cluttered speech   . Weight gain   . Vision loss   . Confusion   . HA (headache)     PAST SURGICAL HISTORY: Past Surgical History  Procedure Laterality Date  . Abdominal hysterectomy    . Uterine fibroid surgery    Dec 19th 2011 at [facility], for intracranial bleeding, right craniotomy,  April 24th 2014, bone flap   FAMILY HISTORY: Family History  Problem Relation Age of Onset  . Migraines Mother   . High blood pressure Father     SOCIAL HISTORY:  History   Social History  . Marital Status: divorced    Spouse Name: N/A    Number of Children: 3  . 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 Smoking status: Never Smoker   . Smokeless tobacco: Never Used  . Alcohol Use: No  . Drug Use: No  . Sexual Activity: Not on file   Other Topics Concern  . Not on file   Social History Narrative   Patient lives at home with her sister [name]).   Education high school    Diaabled.   Right handed.     PHYSICAL EXAM   Filed Vitals:   10/13/14 1049  BP: 130/81  Pulse: 93  Height: 5' (1.524 m)  Weight: 245 lb (111.131 kg)    Not recorded      Body mass index is 47.85 kg/(m^2).  PHYSICAL EXAMNIATION:  Gen: NAD, conversant, well nourised, obese, well groomed                     Cardiovascular: Regular rate rhythm, no peripheral edema, warm, nontender. Eyes: Conjunctivae clear without exudates or hemorrhage Neck: Supple, no carotid bruise. Pulmonary: Clear to auscultation bilaterally   NEUROLOGICAL EXAM:  MENTAL STATUS: Speech:    Speech is normal; fluent and spontaneous with normal comprehension.  Cognition:   MMSE 26/30, she is not oriented to date, day, place, could not copy design, animal naming 14.  CRANIAL NERVES: CN II: Visual fields are full to confrontation. Fundoscopic exam is normal with sharp discs and no vascular  changes. Venous pulsations are present bilaterally. Pupils are 4 mm and briskly reactive to light. Visual acuity is 20/20 bilaterally. CN III, IV, VI: extraocular movement are normal. No ptosis. She has a left hemianopia CN V: Facial sensation is intact to pinprick in all 3 divisions bilaterally. Corneal responses are intact.  CN VII: Face is symmetric with normal eye closure and smile. CN VIII: Hearing is normal to rubbing fingers CN IX, X: Palate elevates symmetrically. Phonation is normal. CN XI: Head turning and shoulder shrug are intact CN XII: Tongue is midline with normal movements and no atrophy.  MOTOR: There is no pronator drift of out-stretched arms. Muscle bulk and tone are normal. Muscle strength is normal.   Shoulder abduction Shoulder external rotation Elbow flexion Elbow extension Wrist flexion Wrist extension Finger abduction Hip flexion Knee flexion Knee extension Ankle dorsi flexion Ankle plantar flexion  R '5 5 5 5 5 5 5 5 5 5 ' 5  5  L '5 5 5 5 5 5 5 5 5 5 5 5    ' REFLEXES: Reflexes are 2+ and symmetric at the biceps, triceps, knees, and ankles. Plantar responses are flexor.  SENSORY: Light touch, pinprick, position sense, and vibration sense are intact in fingers and toes.  COORDINATION: Rapid alternating movements and fine finger movements are intact. There is no dysmetria on finger-to-nose and heel-knee-shin. There are no abnormal or extraneous movements.   GAIT/STANCE: Posture is normal. Gait is steady with normal steps, base, arm swing, and turning. Heel and toe walking are normal. Tandem gait is normal.  Romberg is absent.   DIAGNOSTIC DATA (LABS, IMAGING, TESTING) - I reviewed patient records, labs, notes, testing and imaging myself where available.  Lab Results  Component Value Date   WBC 8.9 08/19/2014   HGB 12.6 08/19/2014   HCT 38.5 08/19/2014   MCV 92.8 08/19/2014   PLT 463* 08/19/2014      Component Value Date/Time   NA 140 08/19/2014 1104   K  4.5 08/19/2014 1104   CL 103 08/19/2014 1104   CO2 29 08/19/2014 1104   GLUCOSE 89 08/19/2014 1104   BUN 7 08/19/2014 1104   CREATININE 0.83 08/19/2014 1104   CREATININE .8 01/02/2010 2137   CALCIUM 10.1 08/19/2014 1104   PROT 7.0 06/06/2011 1231   ALBUMIN 3.8 06/06/2011 1231   AST 12 06/06/2011 1231   ALT 8 06/06/2011 1231   ALKPHOS 57 06/06/2011 1231   BILITOT 0.2* 06/06/2011 1231   GFRNONAA >60 01/02/2010 2137   GFRAA  01/02/2010 2137    >60        The eGFR has been calculated using the MDRD equation. This calculation has not been validated in all clinical situations. eGFR's persistently <60 mL/min signify possible Chronic Kidney Disease.   Lab Results  Component Value Date   CHOL 211* 09/30/2009   HDL 68 09/30/2009   LDLCALC 129* 09/30/2009   TRIG 72 09/30/2009   CHOLHDL 3.1 Ratio 09/30/2009   No results found for: HGBA1C No results found for: VITAMINB12 Lab Results  Component Value Date   TSH 0.514 08/19/2014     ASSESSMENT AND PLAN  PRESLEE REGAS is a 39 y.o. female with past medical history of pseudotumor cerebri, status post a right transverse sinus stenting, developed subdural hematoma, required emergency right craniotomy, repeat CAT scan so large-size chronic right parietal, occipital region encephalomalacia, abnormal , showed right hemisphere slowing  Topamax has helped her headaches, but she complains of cognitive slowing,  After discussed with patient and her sister, we decided to keep her on current dose of Topamax 100 mg twice a day Return to clinic in 1-2 months, if she continue have significant side effect, may consider switch her to different antiepileptic medications,  Marcial Pacas, M.D. Ph.D.  [facility] [address] [place], [place] [address] [phone]

## 2014-11-26 ENCOUNTER — Ambulatory Visit: Payer: Medicare Other | Admitting: Neurology

## 2014-12-10 ENCOUNTER — Ambulatory Visit: Payer: Medicare Other | Admitting: Neurology

## 2015-04-06 ENCOUNTER — Ambulatory Visit (INDEPENDENT_AMBULATORY_CARE_PROVIDER_SITE_OTHER): Payer: Medicare Other | Admitting: Family Medicine

## 2015-04-06 ENCOUNTER — Encounter: Payer: Self-pay | Admitting: *Deleted

## 2015-04-06 ENCOUNTER — Encounter: Payer: Self-pay | Admitting: Family Medicine

## 2015-04-06 VITALS — BP 136/87 | HR 104 | Temp 98.2°F | Wt 241.0 lb

## 2015-04-06 DIAGNOSIS — R7309 Other abnormal glucose: Secondary | ICD-10-CM

## 2015-04-06 DIAGNOSIS — E7800 Pure hypercholesterolemia, unspecified: Secondary | ICD-10-CM

## 2015-04-06 DIAGNOSIS — Z0000 Encounter for general adult medical examination without abnormal findings: Secondary | ICD-10-CM

## 2015-04-06 LAB — POCT GLYCOSYLATED HEMOGLOBIN (HGB A1C): HEMOGLOBIN A1C: 5.6

## 2015-04-06 NOTE — Patient Instructions (Signed)
Thank you for coming to see me today. It was a pleasure. Today we talked about:   I am getting some labs. We discussed possibly starting some therapy for your depressed mood. At the present time, you and your sister appear to not want this so we can discuss this at another time.  Please make an appointment to see me in 6 months for follow-up.  If you have any questions or concerns, please do not hesitate to call the office at [phone].  Sincerely,  Cordelia Poche, MD

## 2015-04-06 NOTE — Progress Notes (Unsigned)
Sister brought in form for handicap placard.  Clinic portion completed and provider signed form.  Patient was still in clinic and given original form back. Miray Mancino,CMA

## 2015-04-06 NOTE — Progress Notes (Signed)
    Subjective    Caitlin Clayton is a 39 y.o. female that presents for a follow-up visit and physical.   Concerns: 1. Handicap placard: Patient not able to walk long distances. This is a chronic issue after having cerebral angioplasty.   2. History of brain surgeries: See's Dr. Dalbert Batman, Neurosurgery at [facility] (previously at [facility]) for regularly for follow-up.  Social History  Substance Use Topics  . Smoking status: Never Smoker   . Smokeless tobacco: Never Used  . Alcohol Use: No    Allergies  Allergen Reactions  . Latex Itching  . Tramadol Itching  . Tramadol Hcl     REACTION: hives    No orders of the defined types were placed in this encounter.    ROS  Neurologic: Headaches Gynecologic: No menstrual cycles Psych: depressed mood  Objective   BP 136/87 mmHg  Pulse 104  Temp(Src) 98.2 F (36.8 C) (Oral)  Wt 241 lb (109.317 kg)  LMP  (Approximate)  General: Well appearing, no distress HEENT: Skull deformity on left side, RIght TM not visualized secondary to dry cerumen impaction. Oropharynx clear with poor dentition and broken teeth, no cervical adenopathy Respiratory/Chest: Clear to auscultation bilaterally, no wheezing Cardiovascular: Regular rate and rhythm Gastrointestinal: Soft, non-tender, obese Genitourinary: Not examined    Extremities: Normal bulk and tone Neuro: Alert, oriented, 5/5 upper and lower extremity strength, CN intact Psych: flat affect, no SI  Assessment and Plan    Healthcare maintenance:  Discussed depressed mood especially with patient feeling like a burden. Currently not interested in treatment. Will need to revisit  A1C, Hepatic function, direct LDL today  No need for pap smear as she is s/p hysterectomy with no previous abnormal pap smear

## 2015-04-07 ENCOUNTER — Other Ambulatory Visit: Payer: Self-pay | Admitting: Family Medicine

## 2015-04-07 ENCOUNTER — Encounter: Payer: Self-pay | Admitting: Family Medicine

## 2015-04-07 LAB — HEPATIC FUNCTION PANEL
ALK PHOS: 59 U/L (ref 33–115)
ALT: 8 U/L (ref 6–29)
AST: 11 U/L (ref 10–30)
Albumin: 4 g/dL (ref 3.6–5.1)
BILIRUBIN DIRECT: 0.1 mg/dL (ref <=0.2)
Indirect Bilirubin: 0.1 mg/dL — ABNORMAL LOW (ref 0.2–1.2)
Total Bilirubin: 0.2 mg/dL (ref 0.2–1.2)
Total Protein: 7.3 g/dL (ref 6.1–8.1)

## 2015-04-07 LAB — LDL CHOLESTEROL, DIRECT: Direct LDL: 155 mg/dL — ABNORMAL HIGH (ref <130)

## 2015-05-10 ENCOUNTER — Other Ambulatory Visit: Payer: Self-pay | Admitting: Family Medicine

## 2015-06-04 ENCOUNTER — Other Ambulatory Visit: Payer: Self-pay | Admitting: Family Medicine

## 2015-06-04 NOTE — Telephone Encounter (Signed)
90 day supply written on 05/11/2015

## 2015-06-05 ENCOUNTER — Other Ambulatory Visit: Payer: Self-pay | Admitting: Family Medicine

## 2015-06-05 NOTE — Telephone Encounter (Signed)
Pt needs a refill on estradiol (ESTRACE) 2 MG tablet sent to [facility] on [address]. Thank you, Fonda Kinder, ASA

## 2015-06-12 MED ORDER — ESTRADIOL 2 MG PO TABS: ORAL_TABLET | ORAL | Status: DC

## 2015-06-12 NOTE — Telephone Encounter (Signed)
Pt informed of below. Zimmerman Rumple, April D, CMA  

## 2015-06-12 NOTE — Telephone Encounter (Signed)
Please inform that patient will need to follow-up with me for future refills of this medicaiton

## 2015-07-02 ENCOUNTER — Other Ambulatory Visit: Payer: Self-pay | Admitting: Family Medicine

## 2015-07-02 NOTE — Telephone Encounter (Signed)
Refill request from pharmacy. Will forward to PCP for review. Sigifredo Pignato, CMA. 

## 2015-08-03 ENCOUNTER — Other Ambulatory Visit: Payer: Self-pay | Admitting: Family Medicine

## 2015-08-04 NOTE — Telephone Encounter (Signed)
Refill approved. Sent to pharmacy. 

## 2015-10-26 ENCOUNTER — Other Ambulatory Visit: Payer: Self-pay | Admitting: Family Medicine

## 2016-01-21 ENCOUNTER — Other Ambulatory Visit: Payer: Self-pay | Admitting: Family Medicine

## 2016-03-21 ENCOUNTER — Other Ambulatory Visit: Payer: Self-pay | Admitting: *Deleted

## 2016-03-22 MED ORDER — GABAPENTIN 300 MG PO CAPS: 300.0000 mg | ORAL_CAPSULE | Freq: Three times a day (TID) | ORAL | 2 refills | Status: DC

## 2016-03-22 MED ORDER — AMITRIPTYLINE HCL 75 MG PO TABS: 75.0000 mg | ORAL_TABLET | Freq: Every day | ORAL | 0 refills | Status: DC

## 2016-05-23 ENCOUNTER — Other Ambulatory Visit: Payer: Self-pay | Admitting: *Deleted

## 2016-05-25 MED ORDER — ESTRADIOL 2 MG PO TABS: 2.0000 mg | ORAL_TABLET | Freq: Every day | ORAL | 0 refills | Status: DC

## 2016-06-16 ENCOUNTER — Other Ambulatory Visit: Payer: Self-pay | Admitting: Family Medicine

## 2016-08-16 IMAGING — CT CT HEAD WO/W CM
1 of 2 series · 13 of 30 positions shown, 17 images · IV contrast (omnipaque)
Comparison: CT head 12/20/2011

CLINICAL DATA: Memory loss.  Headache.

EXAM:
CT HEAD WITHOUT AND WITH CONTRAST
TECHNIQUE: Contiguous axial images were obtained from the base of the skull
through the vertex without and with intravenous contrast
CONTRAST:  80 mL Omnipaque 300 IV

[Series 3: head wo 5.0 h30s · axial · 0.45mm/px · z∈[-122,-2]mm · 13 of 30 slices shown, 17 images]
[im 3/30  brain]
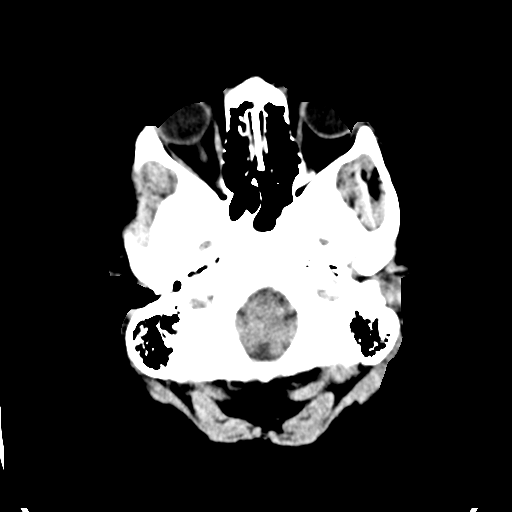
[im 3/30  bone]
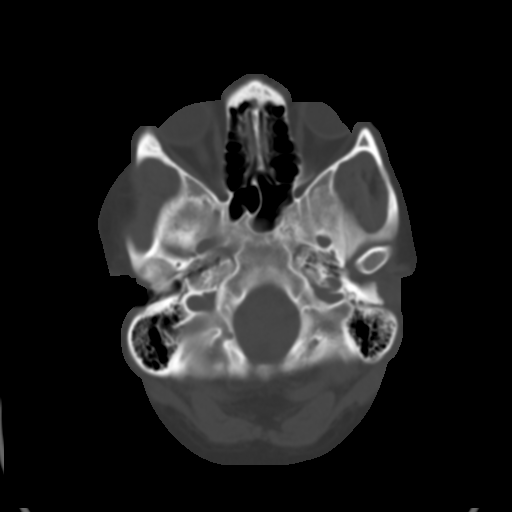
[im 5/30  brain]
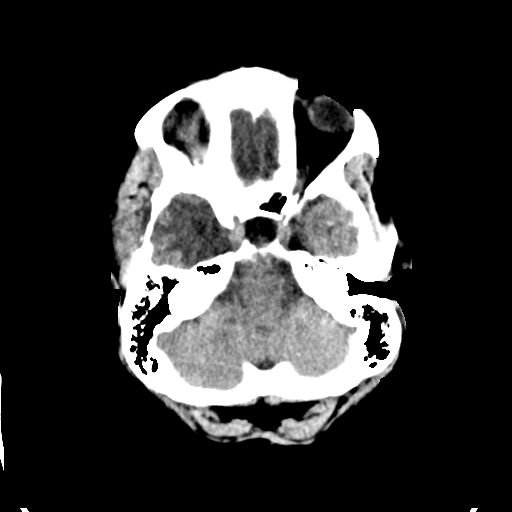
[im 7/30  brain]
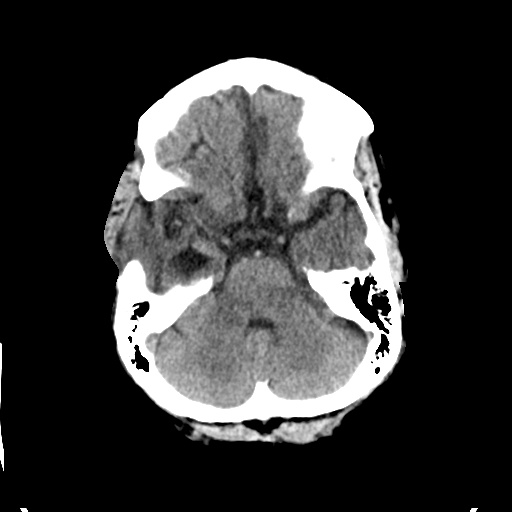
[im 9/30  brain]
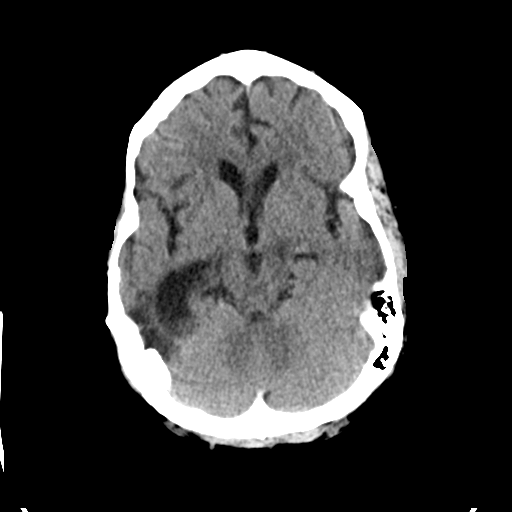
[im 11/30  brain]
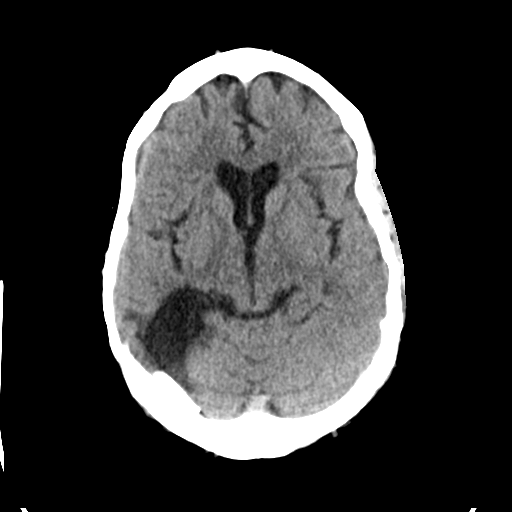
[im 11/30  bone]
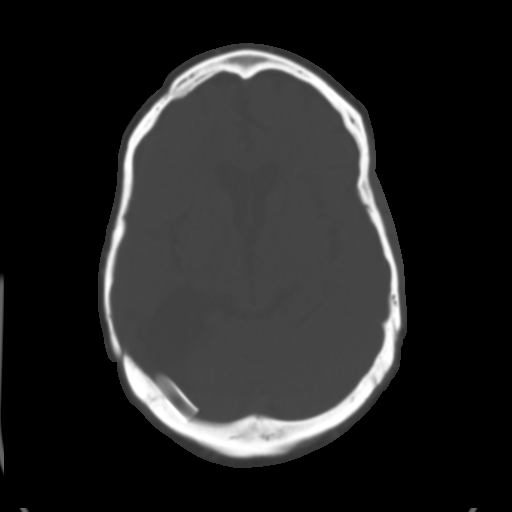
[im 13/30  brain]
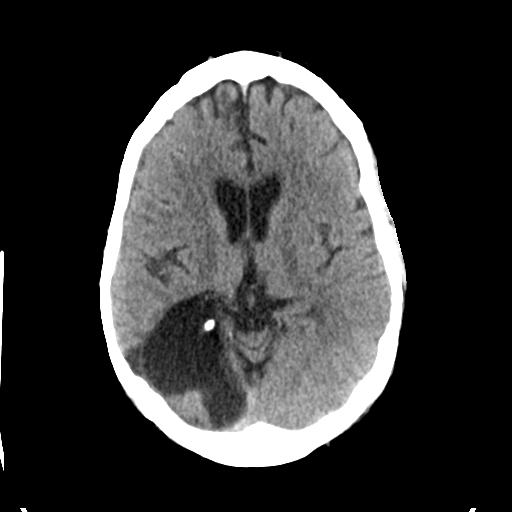
[im 15/30  brain]
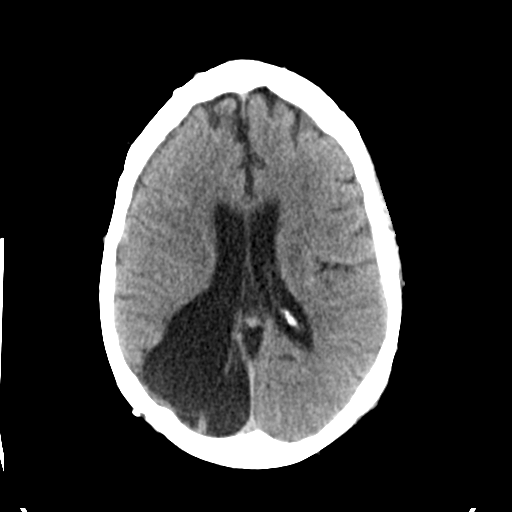
[im 17/30  brain]
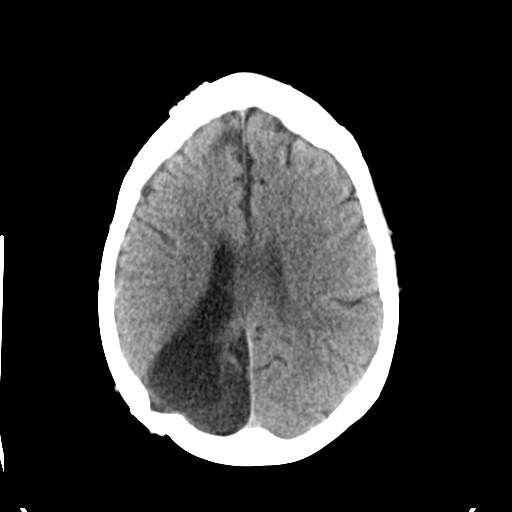
[im 19/30  brain]
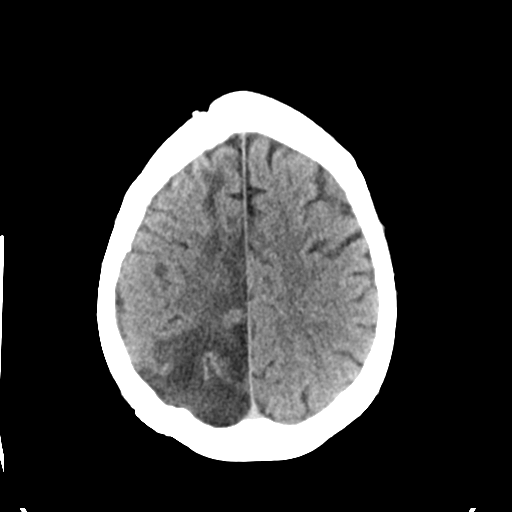
[im 19/30  bone]
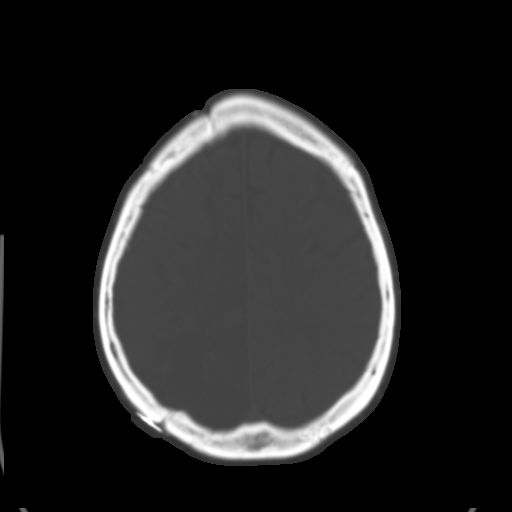
[im 21/30  brain]
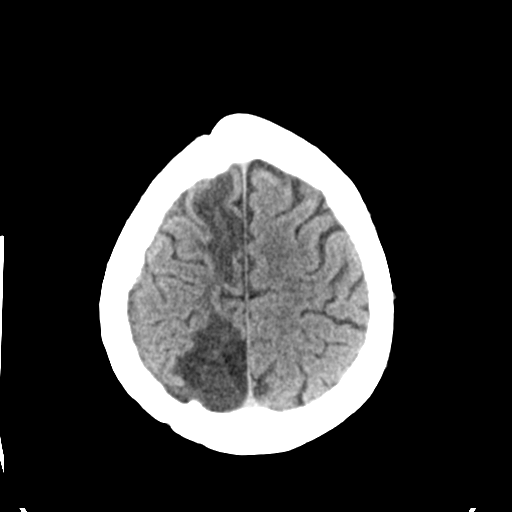
[im 23/30  brain]
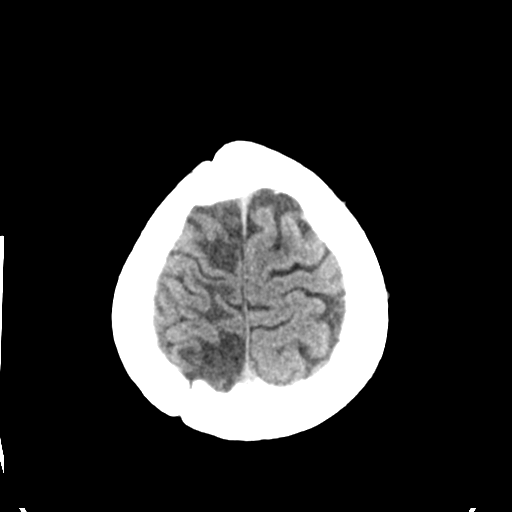
[im 25/30  brain]
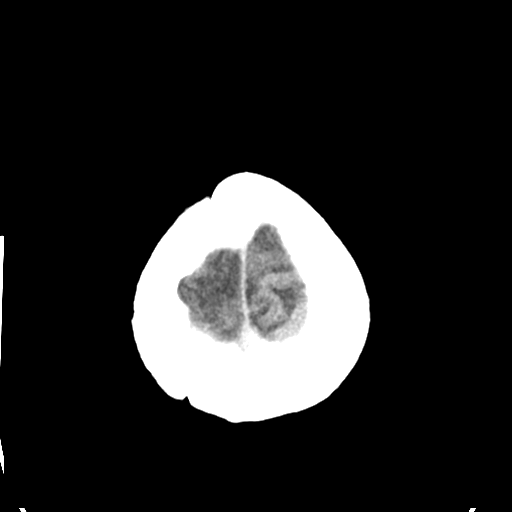
[im 27/30  brain]
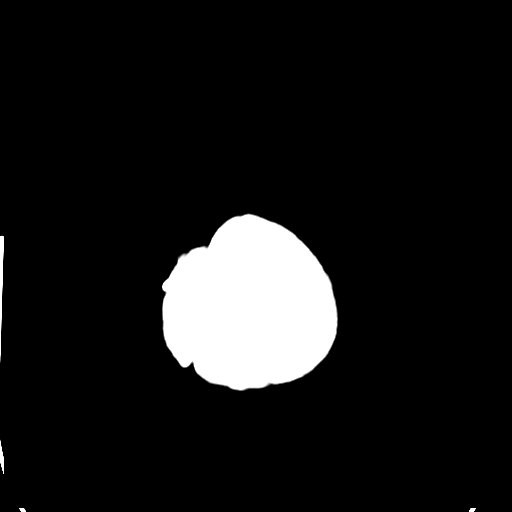
[im 27/30  bone]
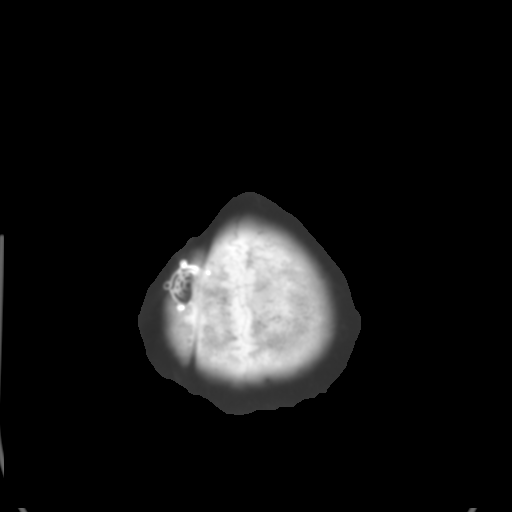

[13 of 30 positions shown; findings below may reference images not displayed]

FINDINGS: Chronic infarct in the right occipital lobe is unchanged. Chronic
right anterior cerebral artery infarct has progressed in hypodensity
and encephalomalacia since the prior study when it was subacute.
Encephalomalacia in the right temporal lobe is unchanged.

Postop extensive craniotomy on the right. Bone flap has been
replaced. Negative for fluid collection. No acute hemorrhage or
acute infarct

Metal stent is present in the right transverse sinus unchanged from
the prior study. The sinus appears to be patent with contrast seen
in the vein proximal and distal to the stent. No evidence of dural
sinus thrombosis.

No acute bony abnormality.
IMPRESSION: Extensive chronic ischemic changes on the right. No acute infarct.
No intracranial hemorrhage.

Right transverse sinus stent unchanged in position. The right
transverse sinus appears patent.

## 2016-09-07 ENCOUNTER — Other Ambulatory Visit: Payer: Self-pay | Admitting: Family Medicine

## 2016-12-07 ENCOUNTER — Other Ambulatory Visit: Payer: Self-pay | Admitting: Family Medicine

## 2017-02-27 ENCOUNTER — Other Ambulatory Visit: Payer: Self-pay | Admitting: Family Medicine

## 2017-03-24 ENCOUNTER — Other Ambulatory Visit: Payer: Self-pay | Admitting: Family Medicine

## 2017-03-24 DIAGNOSIS — Z1231 Encounter for screening mammogram for malignant neoplasm of breast: Secondary | ICD-10-CM

## 2017-04-07 ENCOUNTER — Ambulatory Visit: Payer: Medicare Other

## 2017-06-01 ENCOUNTER — Other Ambulatory Visit: Payer: Self-pay | Admitting: Family Medicine

## 2017-08-17 ENCOUNTER — Other Ambulatory Visit: Payer: Self-pay | Admitting: Family Medicine

## 2018-04-24 ENCOUNTER — Ambulatory Visit: Payer: Medicare Other

## 2018-05-11 ENCOUNTER — Ambulatory Visit: Payer: Medicare Other

## 2021-11-15 DIAGNOSIS — H04123 Dry eye syndrome of bilateral lacrimal glands: Secondary | ICD-10-CM | POA: Diagnosis not present

## 2021-11-15 DIAGNOSIS — H25811 Combined forms of age-related cataract, right eye: Secondary | ICD-10-CM | POA: Diagnosis not present

## 2021-11-15 DIAGNOSIS — H25812 Combined forms of age-related cataract, left eye: Secondary | ICD-10-CM | POA: Diagnosis not present

## 2021-11-15 DIAGNOSIS — H40013 Open angle with borderline findings, low risk, bilateral: Secondary | ICD-10-CM | POA: Diagnosis not present

## 2021-11-15 DIAGNOSIS — H47293 Other optic atrophy, bilateral: Secondary | ICD-10-CM | POA: Diagnosis not present

## 2021-12-18 DIAGNOSIS — S8391XA Sprain of unspecified site of right knee, initial encounter: Secondary | ICD-10-CM | POA: Diagnosis not present

## 2021-12-18 DIAGNOSIS — S80919A Unspecified superficial injury of unspecified knee, initial encounter: Secondary | ICD-10-CM | POA: Diagnosis not present

## 2021-12-18 DIAGNOSIS — R2689 Other abnormalities of gait and mobility: Secondary | ICD-10-CM | POA: Diagnosis not present

## 2021-12-18 DIAGNOSIS — S8991XA Unspecified injury of right lower leg, initial encounter: Secondary | ICD-10-CM | POA: Diagnosis not present

## 2021-12-18 DIAGNOSIS — W19XXXA Unspecified fall, initial encounter: Secondary | ICD-10-CM | POA: Diagnosis not present

## 2021-12-18 DIAGNOSIS — W010XXA Fall on same level from slipping, tripping and stumbling without subsequent striking against object, initial encounter: Secondary | ICD-10-CM | POA: Diagnosis not present

## 2021-12-20 DIAGNOSIS — Z1239 Encounter for other screening for malignant neoplasm of breast: Secondary | ICD-10-CM | POA: Diagnosis not present

## 2021-12-20 DIAGNOSIS — M25569 Pain in unspecified knee: Secondary | ICD-10-CM | POA: Diagnosis not present

## 2022-02-04 DIAGNOSIS — Z1231 Encounter for screening mammogram for malignant neoplasm of breast: Secondary | ICD-10-CM | POA: Diagnosis not present

## 2022-03-14 DIAGNOSIS — R21 Rash and other nonspecific skin eruption: Secondary | ICD-10-CM | POA: Diagnosis not present

## 2022-03-14 DIAGNOSIS — Z0000 Encounter for general adult medical examination without abnormal findings: Secondary | ICD-10-CM | POA: Diagnosis not present

## 2022-03-14 DIAGNOSIS — G43809 Other migraine, not intractable, without status migrainosus: Secondary | ICD-10-CM | POA: Diagnosis not present

## 2022-03-14 DIAGNOSIS — G932 Benign intracranial hypertension: Secondary | ICD-10-CM | POA: Diagnosis not present

## 2022-03-14 DIAGNOSIS — Z1211 Encounter for screening for malignant neoplasm of colon: Secondary | ICD-10-CM | POA: Diagnosis not present

## 2022-03-22 DIAGNOSIS — M1711 Unilateral primary osteoarthritis, right knee: Secondary | ICD-10-CM | POA: Diagnosis not present

## 2022-05-12 ENCOUNTER — Ambulatory Visit (AMBULATORY_SURGERY_CENTER): Payer: Self-pay | Admitting: *Deleted

## 2022-05-12 VITALS — Ht 60.0 in | Wt 235.0 lb

## 2022-05-12 DIAGNOSIS — Z1211 Encounter for screening for malignant neoplasm of colon: Secondary | ICD-10-CM

## 2022-05-12 MED ORDER — PEG 3350-KCL-NA BICARB-NACL 420 G PO SOLR: 4000.0000 mL | Freq: Once | ORAL | 0 refills | Status: AC

## 2022-05-12 NOTE — Progress Notes (Signed)
No egg or soy allergy known to patient  No issues known to pt with past sedation with any surgeries or procedures Patient denies ever being told they had issues or difficulty with intubation  No FH of Malignant Hyperthermia Pt is not on diet pills Pt is not on  home 02  Pt is not on blood thinners  Pt  takes colace to prevent constipation ,extra miralax given with prep. No A fib or A flutter Have any cardiac testing pending--no Pt instructed to use Singlecare.com or GoodRx for a price reduction on prep

## 2022-05-18 ENCOUNTER — Telehealth: Payer: Self-pay | Admitting: Gastroenterology

## 2022-05-18 NOTE — Telephone Encounter (Signed)
Patient mother called states patient vegetable laxative daily. But wondering when can she stop taking it for procedure on 10/11. Requesting a call back on [phone].

## 2022-05-18 NOTE — Telephone Encounter (Signed)
Spoke to the patient's sister who is her primary caregiver.  Went over instructions with her and printed a copy for her to pick up tomorrow with front desk.

## 2022-05-23 ENCOUNTER — Encounter: Payer: Self-pay | Admitting: Certified Registered Nurse Anesthetist

## 2022-05-25 ENCOUNTER — Encounter: Payer: Self-pay | Admitting: Gastroenterology

## 2022-05-25 ENCOUNTER — Ambulatory Visit (AMBULATORY_SURGERY_CENTER): Payer: Medicare HMO | Admitting: Gastroenterology

## 2022-05-25 VITALS — BP 127/64 | HR 66 | Temp 97.1°F | Resp 16 | Ht 60.0 in | Wt 235.0 lb

## 2022-05-25 DIAGNOSIS — D128 Benign neoplasm of rectum: Secondary | ICD-10-CM | POA: Diagnosis not present

## 2022-05-25 DIAGNOSIS — K621 Rectal polyp: Secondary | ICD-10-CM

## 2022-05-25 DIAGNOSIS — D125 Benign neoplasm of sigmoid colon: Secondary | ICD-10-CM | POA: Diagnosis not present

## 2022-05-25 DIAGNOSIS — K635 Polyp of colon: Secondary | ICD-10-CM

## 2022-05-25 DIAGNOSIS — Z1211 Encounter for screening for malignant neoplasm of colon: Secondary | ICD-10-CM

## 2022-05-25 MED ORDER — SODIUM CHLORIDE 0.9 % IV SOLN: 500.0000 mL | Freq: Once | INTRAVENOUS | Status: DC

## 2022-05-25 NOTE — Progress Notes (Signed)
Report given to PACU, vss 

## 2022-05-25 NOTE — Progress Notes (Signed)
[facility] History and Physical   Primary Care Physician:  Katherina Mires, MD   Reason for Procedure:  Colorectal cancer screening  Plan:    Screening colonoscopy with possible interventions as needed     HPI: Caitlin Clayton is a very pleasant 46 y.o. female here for screening colonoscopy. Denies any nausea, vomiting, abdominal pain, melena or bright red blood per rectum  The risks and benefits as well as alternatives of endoscopic procedure(s) have been discussed and reviewed. All questions answered. The patient agrees to proceed.    Past Medical History:  Diagnosis Date   Arthritis    KNEE   Cluttered speech    Confusion    HA (headache)    Hyperlipidemia    Memory loss    Pre-diabetes    Vision loss    Weight gain     Past Surgical History:  Procedure Laterality Date   ABDOMINAL HYSTERECTOMY     BRAIN SURGERY  08/15/2010   x 2,2011,2012   MOUTH SURGERY     major oral surgery,wears dentures   UTERINE FIBROID SURGERY      Prior to Admission medications   Medication Sig Start Date End Date Taking? Authorizing Provider  amitriptyline (ELAVIL) 75 MG tablet TAKE 1 TABLET BY MOUTH AT BEDTIME 08/17/17  Yes Eloise Levels, MD  aspirin 81 MG tablet Take 81 mg by mouth daily.    [provider]  Docusate Sodium (DSS) 100 MG CAPS Take by mouth.    [provider]  estradiol (ESTRACE) 2 MG tablet TAKE 1 TABLET BY MOUTH ONCE DAILY Patient not taking: Reported on 05/12/2022 06/01/17   Eloise Levels, MD  gabapentin (NEURONTIN) 300 MG capsule TAKE 1 CAPSULE BY MOUTH THREE TIMES DAILY 08/17/17   Eloise Levels, MD  ketoconazole (NIZORAL) 2 % shampoo APPLY TOPICALLY TWO TIMES A WEEK 04/01/14   Willeen Niece, MD  multivitamin-iron-minerals-folic acid (CENTRUM) chewable tablet Chew 1 tablet by mouth daily.    [provider]  OVER THE COUNTER MEDICATION Vegetable laxative daily    [provider]  rizatriptan (MAXALT-MLT) 5  MG disintegrating tablet Take 1 tablet (5 mg total) by mouth as needed. May repeat in 2 hours if needed Patient not taking: Reported on 04/06/2015 09/04/14   Marcial Pacas, MD  topiramate (TOPAMAX) 100 MG tablet 1/2 tab po bid xone week, then one tab bid Patient not taking: Reported on 04/06/2015 09/04/14   Marcial Pacas, MD    Current Outpatient Medications  Medication Sig Dispense Refill   amitriptyline (ELAVIL) 75 MG tablet TAKE 1 TABLET BY MOUTH AT BEDTIME 90 tablet 0   aspirin 81 MG tablet Take 81 mg by mouth daily.     Docusate Sodium (DSS) 100 MG CAPS Take by mouth.     estradiol (ESTRACE) 2 MG tablet TAKE 1 TABLET BY MOUTH ONCE DAILY (Patient not taking: Reported on 05/12/2022) 90 tablet 0   gabapentin (NEURONTIN) 300 MG capsule TAKE 1 CAPSULE BY MOUTH THREE TIMES DAILY 270 capsule 2   ketoconazole (NIZORAL) 2 % shampoo APPLY TOPICALLY TWO TIMES A WEEK 120 mL 0   multivitamin-iron-minerals-folic acid (CENTRUM) chewable tablet Chew 1 tablet by mouth daily.     OVER THE COUNTER MEDICATION Vegetable laxative daily     rizatriptan (MAXALT-MLT) 5 MG disintegrating tablet Take 1 tablet (5 mg total) by mouth as needed. May repeat in 2 hours if needed (Patient not taking: Reported on 04/06/2015) 15 tablet 11   topiramate (  TOPAMAX) 100 MG tablet 1/2 tab po bid xone week, then one tab bid (Patient not taking: Reported on 04/06/2015) 60 tablet 11   Current Facility-Administered Medications  Medication Dose Route Frequency Provider Last Rate Last Admin   0.9 %  sodium chloride infusion  500 mL Intravenous Once Mauri Pole, MD        Allergies as of 05/25/2022 - Review Complete 05/25/2022  Allergen Reaction Noted   Latex Itching 04/23/2013   Tramadol Itching 09/04/2014   Tramadol hcl  09/16/2009    Family History  Problem Relation Age of Onset   Migraines Mother    High blood pressure Father    Colon cancer Neg Hx    Colon polyps Neg Hx    Crohn's disease Neg Hx    Esophageal cancer Neg  Hx    Rectal cancer Neg Hx    Stomach cancer Neg Hx    Ulcerative colitis Neg Hx     Social History   Socioeconomic History   Marital status: Married    Spouse name: Not on file   Number of children: 3   Years of education: 12   Highest education level: Not on file  Occupational History   Not on file  Tobacco Use   Smoking status: Never    Passive exposure: Past (parents smoked)   Smokeless tobacco: Never  Vaping Use   Vaping Use: Never used  Substance and Sexual Activity   Alcohol use: No    Alcohol/week: 0.0 standard drinks of alcohol   Drug use: No   Sexual activity: Never    Birth control/protection: Abstinence  Other Topics Concern   Not on file  Social History Narrative   Patient lives at home with her sister [name]).   Education high school    Diaabled.   Right handed.   Social Determinants of Health   Financial Resource Strain: Not on file  Food Insecurity: Not on file  Transportation Needs: Not on file  Physical Activity: Not on file  Stress: Not on file  Social Connections: Not on file  Intimate Partner Violence: Not on file    Review of Systems:  All other review of systems negative except as mentioned in the HPI.  Physical Exam: Vital signs in last 24 hours: Blood Pressure 126/66   Pulse 75   Temperature (Abnormal) 97.1 F (36.2 C) (Temporal)   Height 5' (1.524 m)   Weight 235 lb (106.6 kg)   Oxygen Saturation 98%   Body Mass Index 45.90 kg/m  General:   Alert, NAD Lungs:  Clear .   Heart:  Regular rate and rhythm Abdomen:  Soft, nontender and nondistended. Neuro/Psych:  Alert and cooperative. Normal mood and affect. A and O x 3  Reviewed labs, radiology imaging, old records and pertinent past GI work up  Patient is appropriate for planned procedure(s) and anesthesia in an ambulatory setting   K. Denzil Magnuson , MD [phone]

## 2022-05-25 NOTE — Patient Instructions (Signed)
Handouts on polyps, diverticulosis, and hemorrhoids given to you today Await pathology results   YOU HAD AN ENDOSCOPIC PROCEDURE TODAY AT THE [facility]:   Refer to the procedure report that was given to you for any specific questions about what was found during the examination.  If the procedure report does not answer your questions, please call your gastroenterologist to clarify.  If you requested that your care partner not be given the details of your procedure findings, then the procedure report has been included in a sealed envelope for you to review at your convenience later.  YOU SHOULD EXPECT: Some feelings of bloating in the abdomen. Passage of more gas than usual.  Walking can help get rid of the air that was put into your GI tract during the procedure and reduce the bloating. If you had a lower endoscopy (such as a colonoscopy or flexible sigmoidoscopy) you may notice spotting of blood in your stool or on the toilet paper. If you underwent a bowel prep for your procedure, you may not have a normal bowel movement for a few days.  Please Note:  You might notice some irritation and congestion in your nose or some drainage.  This is from the oxygen used during your procedure.  There is no need for concern and it should clear up in a day or so.  SYMPTOMS TO REPORT IMMEDIATELY:  Following lower endoscopy (colonoscopy or flexible sigmoidoscopy):  Excessive amounts of blood in the stool  Significant tenderness or worsening of abdominal pains  Swelling of the abdomen that is new, acute  Fever of 100F or higher  For urgent or emergent issues, a gastroenterologist can be reached at any hour by calling [phone]. Do not use MyChart messaging for urgent concerns.    DIET:  We do recommend a small meal at first, but then you may proceed to your regular diet.  Drink plenty of fluids but you should avoid alcoholic beverages for 24 hours.  ACTIVITY:  You should plan to take it  easy for the rest of today and you should NOT DRIVE or use heavy machinery until tomorrow (because of the sedation medicines used during the test).    FOLLOW UP: Our staff will call the number listed on your records the next business day following your procedure.  We will call around 7:15- 8:00 am to check on you and address any questions or concerns that you may have regarding the information given to you following your procedure. If we do not reach you, we will leave a message.     If any biopsies were taken you will be contacted by phone or by letter within the next 1-3 weeks.  Please call us at [phone] if you have not heard about the biopsies in 3 weeks.    SIGNATURES/CONFIDENTIALITY: You and/or your care partner have signed paperwork which will be entered into your electronic medical record.  These signatures attest to the fact that that the information above on your After Visit Summary has been reviewed and is understood.  Full responsibility of the confidentiality of this discharge information lies with you and/or your care-partner. 

## 2022-05-25 NOTE — Progress Notes (Signed)
Pt's states no medical or surgical changes since previsit or office visit. 

## 2022-05-25 NOTE — Op Note (Signed)
[facility] Patient Name: Caitlin Clayton Procedure Date: 05/25/2022 11:28 AM MRN: [id number] Endoscopist: Mauri Pole , MD Age: 47 Referring MD:  Date of Birth: [birth year] Gender: Female Account #: [id number] Procedure:                Colonoscopy Indications:              Screening for colorectal malignant neoplasm Medicines:                Monitored Anesthesia Care Procedure:                Pre-Anesthesia Assessment:                           - Prior to the procedure, a History and Physical                            was performed, and patient medications and                            allergies were reviewed. The patient's tolerance of                            previous anesthesia was also reviewed. The risks                            and benefits of the procedure and the sedation                            options and risks were discussed with the patient.                            All questions were answered, and informed consent                            was obtained. Prior Anticoagulants: The patient has                            taken no previous anticoagulant or antiplatelet                            agents. ASA Grade Assessment: II - A patient with                            mild systemic disease. After reviewing the risks                            and benefits, the patient was deemed in                            satisfactory condition to undergo the procedure.                           After obtaining informed consent, the colonoscope  was passed under direct vision. Throughout the                            procedure, the patient's blood pressure, pulse, and                            oxygen saturations were monitored continuously. The                            Olympus PCF-H190DL ([id number]) Colonoscope was                            introduced through the anus and advanced to the the                            cecum,  identified by appendiceal orifice and                            ileocecal valve. The colonoscopy was performed                            without difficulty. The patient tolerated the                            procedure well. The quality of the bowel                            preparation was good. The ileocecal valve,                            appendiceal orifice, and rectum were photographed. Scope In: 11:35:06 AM Scope Out: 11:52:52 AM Scope Withdrawal Time: 0 hours 12 minutes 36 seconds  Total Procedure Duration: 0 hours 17 minutes 46 seconds  Findings:                 The perianal and digital rectal examinations were                            normal.                           A 4 mm polyp was found in the sigmoid colon. The                            polyp was sessile. The polyp was removed with a                            cold snare. Resection and retrieval were complete.                           Two sessile polyps were found in the rectum. The                            polyps were 1 to 2 mm in size. These  polyps were                            removed with a cold biopsy forceps. Resection and                            retrieval were complete.                           Scattered small-mouthed diverticula were found in                            the sigmoid colon and ascending colon.                           Non-bleeding external and internal hemorrhoids were                            found during retroflexion. The hemorrhoids were                            medium-sized. Complications:            No immediate complications. Estimated Blood Loss:     Estimated blood loss: Minimal. Impression:               - One 4 mm polyp in the sigmoid colon, removed with                            a cold snare. Resected and retrieved.                           - Two 1 to 2 mm polyps in the rectum, removed with                            a cold biopsy forceps. Resected and retrieved.                            - Diverticulosis in the sigmoid colon and in the                            ascending colon.                           - Non-bleeding external and internal hemorrhoids. Recommendation:           - Patient has a contact number available for                            emergencies. The signs and symptoms of potential                            delayed complications were discussed with the                            patient. Return to normal activities tomorrow.  Written discharge instructions were provided to the                            patient.                           - Resume previous diet.                           - Continue present medications.                           - Await pathology results.                           - Repeat colonoscopy in 5-10 years for surveillance                            based on pathology results. Mauri Pole, MD 05/25/2022 11:59:09 AM This report has been signed electronically.

## 2022-05-26 ENCOUNTER — Telehealth: Payer: Self-pay

## 2022-05-26 NOTE — Telephone Encounter (Signed)
Post procedure follow up call, no answer 

## 2022-05-26 NOTE — Telephone Encounter (Signed)
Inbound call from patient sister stating patient is doing fine . Please give a call back if needing to further advise.   Thank you

## 2022-06-01 ENCOUNTER — Encounter: Payer: Self-pay | Admitting: Gastroenterology

## 2022-11-08 DIAGNOSIS — M1990 Unspecified osteoarthritis, unspecified site: Secondary | ICD-10-CM | POA: Diagnosis not present

## 2022-11-08 DIAGNOSIS — Z87892 Personal history of anaphylaxis: Secondary | ICD-10-CM | POA: Diagnosis not present

## 2022-11-08 DIAGNOSIS — M792 Neuralgia and neuritis, unspecified: Secondary | ICD-10-CM | POA: Diagnosis not present

## 2022-11-08 DIAGNOSIS — Z809 Family history of malignant neoplasm, unspecified: Secondary | ICD-10-CM | POA: Diagnosis not present

## 2022-11-08 DIAGNOSIS — Z5982 Transportation insecurity: Secondary | ICD-10-CM | POA: Diagnosis not present

## 2022-11-08 DIAGNOSIS — Z9181 History of falling: Secondary | ICD-10-CM | POA: Diagnosis not present

## 2022-11-08 DIAGNOSIS — Z8249 Family history of ischemic heart disease and other diseases of the circulatory system: Secondary | ICD-10-CM | POA: Diagnosis not present

## 2022-11-08 DIAGNOSIS — G4700 Insomnia, unspecified: Secondary | ICD-10-CM | POA: Diagnosis not present

## 2022-11-08 DIAGNOSIS — Z825 Family history of asthma and other chronic lower respiratory diseases: Secondary | ICD-10-CM | POA: Diagnosis not present

## 2022-11-08 DIAGNOSIS — K5900 Constipation, unspecified: Secondary | ICD-10-CM | POA: Diagnosis not present

## 2022-11-08 DIAGNOSIS — Z91040 Latex allergy status: Secondary | ICD-10-CM | POA: Diagnosis not present

## 2023-03-17 DIAGNOSIS — M25569 Pain in unspecified knee: Secondary | ICD-10-CM | POA: Diagnosis not present

## 2023-03-17 DIAGNOSIS — Z0000 Encounter for general adult medical examination without abnormal findings: Secondary | ICD-10-CM | POA: Diagnosis not present

## 2023-03-17 DIAGNOSIS — G932 Benign intracranial hypertension: Secondary | ICD-10-CM | POA: Diagnosis not present

## 2023-03-17 DIAGNOSIS — R7303 Prediabetes: Secondary | ICD-10-CM | POA: Diagnosis not present

## 2023-03-23 DIAGNOSIS — H40013 Open angle with borderline findings, low risk, bilateral: Secondary | ICD-10-CM | POA: Diagnosis not present

## 2023-03-23 DIAGNOSIS — H04123 Dry eye syndrome of bilateral lacrimal glands: Secondary | ICD-10-CM | POA: Diagnosis not present

## 2023-03-23 DIAGNOSIS — H25813 Combined forms of age-related cataract, bilateral: Secondary | ICD-10-CM | POA: Diagnosis not present

## 2023-03-23 DIAGNOSIS — H47293 Other optic atrophy, bilateral: Secondary | ICD-10-CM | POA: Diagnosis not present

## 2023-05-18 DIAGNOSIS — R92323 Mammographic fibroglandular density, bilateral breasts: Secondary | ICD-10-CM | POA: Diagnosis not present

## 2023-05-18 DIAGNOSIS — Z1231 Encounter for screening mammogram for malignant neoplasm of breast: Secondary | ICD-10-CM | POA: Diagnosis not present

## 2023-07-18 DIAGNOSIS — G43809 Other migraine, not intractable, without status migrainosus: Secondary | ICD-10-CM | POA: Diagnosis not present

## 2023-07-18 DIAGNOSIS — R7303 Prediabetes: Secondary | ICD-10-CM | POA: Diagnosis not present
